# Patient Record
Sex: Female | Born: 1995
Health system: Southern US, Community
[De-identification: ages and names within clinical notes are randomized; demographics above are authoritative.]

## PROBLEM LIST (undated history)

## (undated) ENCOUNTER — Inpatient Hospital Stay (HOSPITAL_COMMUNITY): Payer: Self-pay

## (undated) DIAGNOSIS — R490 Dysphonia: Secondary | ICD-10-CM

## (undated) DIAGNOSIS — IMO0002 Reserved for concepts with insufficient information to code with codable children: Secondary | ICD-10-CM

## (undated) DIAGNOSIS — Z973 Presence of spectacles and contact lenses: Secondary | ICD-10-CM

## (undated) DIAGNOSIS — R197 Diarrhea, unspecified: Secondary | ICD-10-CM

## (undated) DIAGNOSIS — R61 Generalized hyperhidrosis: Secondary | ICD-10-CM

## (undated) DIAGNOSIS — R51 Headache: Secondary | ICD-10-CM

## (undated) DIAGNOSIS — R112 Nausea with vomiting, unspecified: Secondary | ICD-10-CM

## (undated) DIAGNOSIS — E669 Obesity, unspecified: Secondary | ICD-10-CM

## (undated) DIAGNOSIS — I10 Essential (primary) hypertension: Secondary | ICD-10-CM

## (undated) DIAGNOSIS — K625 Hemorrhage of anus and rectum: Secondary | ICD-10-CM

## (undated) DIAGNOSIS — A048 Other specified bacterial intestinal infections: Secondary | ICD-10-CM

## (undated) DIAGNOSIS — R5383 Other fatigue: Secondary | ICD-10-CM

## (undated) DIAGNOSIS — K3184 Gastroparesis: Secondary | ICD-10-CM

## (undated) DIAGNOSIS — K219 Gastro-esophageal reflux disease without esophagitis: Secondary | ICD-10-CM

## (undated) DIAGNOSIS — Z8711 Personal history of peptic ulcer disease: Secondary | ICD-10-CM

## (undated) HISTORY — DX: Headache: R51

## (undated) HISTORY — DX: Hemorrhage of anus and rectum: K62.5

## (undated) HISTORY — DX: Obesity, unspecified: E66.9

## (undated) HISTORY — DX: Nausea with vomiting, unspecified: R19.7

## (undated) HISTORY — DX: Presence of spectacles and contact lenses: Z97.3

## (undated) HISTORY — DX: Diarrhea, unspecified: R11.2

## (undated) HISTORY — DX: Essential (primary) hypertension: I10

## (undated) HISTORY — DX: Dysphonia: R49.0

## (undated) HISTORY — PX: MOUTH SURGERY: SHX715

## (undated) HISTORY — DX: Gastroparesis: K31.84

## (undated) HISTORY — DX: Generalized hyperhidrosis: R61

## (undated) HISTORY — DX: Other specified bacterial intestinal infections: A04.8

## (undated) HISTORY — DX: Reserved for concepts with insufficient information to code with codable children: IMO0002

## (undated) HISTORY — DX: Gastro-esophageal reflux disease without esophagitis: K21.9

## (undated) HISTORY — DX: Other fatigue: R53.83

---
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
## 2013-12-25 ENCOUNTER — Ambulatory Visit (INDEPENDENT_AMBULATORY_CARE_PROVIDER_SITE_OTHER): Payer: 59 | Admitting: Family Medicine

## 2013-12-25 ENCOUNTER — Encounter: Payer: Self-pay | Admitting: Family Medicine

## 2013-12-25 VITALS — BP 120/82 | Temp 99.2°F | Ht 65.5 in | Wt 310.0 lb

## 2013-12-25 DIAGNOSIS — R110 Nausea: Secondary | ICD-10-CM | POA: Insufficient documentation

## 2013-12-25 DIAGNOSIS — Z7189 Other specified counseling: Secondary | ICD-10-CM

## 2013-12-25 DIAGNOSIS — Z7689 Persons encountering health services in other specified circumstances: Secondary | ICD-10-CM

## 2013-12-25 DIAGNOSIS — N946 Dysmenorrhea, unspecified: Secondary | ICD-10-CM | POA: Insufficient documentation

## 2013-12-25 NOTE — Progress Notes (Signed)
Chief Complaint  Patient presents with  . Establish Care    HPI:  Miranda Hawkins is here to establish care.  Used to see pediatrician.   Has the following chronic problems and concerns today:  Patient Active Problem List   Diagnosis Date Noted  . Chronic nausea - hx gastroparesis, followed by Dr. Jarold Motto in GI 12/25/2013  . Dysmenorrhea, irr vaginal bleeding - followed by Dr. Henderson Cloud in gyn 12/25/2013  . Obesity, morbid 12/25/2013   HTN: -went to ED in the beginning of Jan and had elevated BP and told to see PCP -had abd pain and vomiting at the time - this is chronic and she is seeing GI in a few days  Chronic Vag Bleeding/dysmenorrhea: -for 3 months, followed by gyn - Dr. Henderson Cloud  -saw gyn in [place] a month ago and restarted nuvaring -spotting daily -reports labs check about 1 week ago in ED and not anemic -denies: CP, racing heart, dizziness  Health Maintenance: -does not want flu vaccine  ROS: See pertinent positives and negatives per HPI.  Past Medical History  Diagnosis Date  . Night sweats   . Fatigue   . Nausea vomiting and diarrhea   . Abdominal pain   . Rectal bleeding   . Headache(784.0)   . Hoarseness   . Wears glasses   . Helicobacter pylori (H. pylori)   . Obesity   . Gastroparesis   . Ulcer   . Hypertension     high blood pressure readings  . GERD (gastroesophageal reflux disease)     Family History  Problem Relation Age of Onset  . Diabetes Maternal Grandmother   . Arthritis Maternal Grandmother   . Heart disease Maternal Grandmother   . Diabetes Maternal Aunt   . Hypertension Mother   . Hyperlipidemia Mother   . Colon cancer Neg Hx   . Alcoholism Father   . Arthritis Father     paternal grandparents  . Alcoholism      maternal grandparents  . Hyperlipidemia      all four grandparents  . Heart disease Paternal Grandfather   . Stroke    . Mental illness      father's side    History   Social History  . Marital Status:  Single    Spouse Name: N/A    Number of Children: 0  . Years of Education: N/A   Occupational History  . Student     Social History Main Topics  . Smoking status: Never Smoker   . Smokeless tobacco: Never Used  . Alcohol Use: No  . Drug Use: Yes    Special: Marijuana  . Sexual Activity: None   Other Topics Concern  . 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  , Disp: , Rfl: ;  TraMADol HCl (ULTRAM PO), Take by mouth as directed., Disp: , Rfl:   EXAM:  Filed Vitals:   12/25/13 1611  BP: 120/82  Temp: 99.2 F (37.3 C)    Body mass index is 50.78 kg/(m^2).  GENERAL: vitals reviewed and listed above, alert, oriented, appears well hydrated and in no acute distress  HEENT: atraumatic, conjunttiva clear, no obvious abnormalities on inspection of external nose and ears  NECK: no obvious masses on inspection  LUNGS: clear to auscultation bilaterally, no wheezes, rales or rhonchi, good air movement  CV: HRRR, no peripheral edema  MS: moves all extremities without noticeable abnormality  PSYCH: pleasant and cooperative, no obvious depression or anxiety  ASSESSMENT AND PLAN:  Discussed the following assessment and plan:  Chronic nausea - hx gastroparesis, followed by Dr. Jarold MottoPatterson in GI  Dysmenorrhea, irr vaginal bleeding - followed by Dr. Henderson CloudHorvath in gyn  Encounter to establish care - Plan: Basic metabolic panel, CBC with Differential  Obesity, morbid - Plan: Basic metabolic panel, CBC with  Differential, Lipid Panel, Hemoglobin A1c  -We reviewed the PMH, PSH, FH, SH, Meds and Allergies. -We provided refills for any medications we will prescribe as needed. -We addressed current concerns per orders and patient instructions. -We have asked for records for pertinent exams, studies, vaccines and notes from previous providers. -We have advised patient to follow up per instructions below. -discussed lifestyle recs at length and offered nutrition referral - will check fasting labs  -Patient advised to return or notify a doctor immediately if symptoms worsen or persist or new concerns arise.  Patient Instructions  Please schedule a lab appointment for fasting labs on your way out: -We have ordered labs or studies at this visit. It can take up to 1-2 weeks for results and processing. We will contact you with instructions IF your results are abnormal. Normal results will be released to your [facility]. If you have not heard from us or can not find your results in [facility] in 2 weeks please contact our office.  -PLEASE SIGN UP FOR MYCHART TODAY   We recommend the following healthy lifestyle measures: - eat a healthy diet consisting of lots of vegetables, fruits, beans, nuts, seeds, healthy meats such as white chicken and fish and whole grains.  - avoid fried foods, fast food, processed foods, sodas, red meet and other fattening foods.  - get a least 150 minutes of aerobic exercise per week.   Follow up with your gynecologist regarding your bleeding and your gastroenterologist regarding your chronic nausea and vomiting.       Kriste BasqueKIM, Catarina Huntley R.

## 2013-12-25 NOTE — Progress Notes (Signed)
Pre visit review using our clinic review tool, if applicable. No additional management support is needed unless otherwise documented below in the visit note. 

## 2013-12-25 NOTE — Patient Instructions (Signed)
Please schedule a lab appointment for fasting labs on your way out: -We have ordered labs or studies at this visit. It can take up to 1-2 weeks for results and processing. We will contact you with instructions IF your results are abnormal. Normal results will be released to your [facility]. If you have not heard from us or can not find your results in [facility] in 2 weeks please contact our office.  -PLEASE SIGN UP FOR MYCHART TODAY   We recommend the following healthy lifestyle measures: - eat a healthy diet consisting of lots of vegetables, fruits, beans, nuts, seeds, healthy meats such as white chicken and fish and whole grains.  - avoid fried foods, fast food, processed foods, sodas, red meet and other fattening foods.  - get a least 150 minutes of aerobic exercise per week.   Follow up with your gynecologist regarding your bleeding and your gastroenterologist regarding your chronic nausea and vomiting.

## 2013-12-27 ENCOUNTER — Other Ambulatory Visit (INDEPENDENT_AMBULATORY_CARE_PROVIDER_SITE_OTHER): Payer: 59

## 2013-12-27 DIAGNOSIS — Z7689 Persons encountering health services in other specified circumstances: Secondary | ICD-10-CM

## 2013-12-27 DIAGNOSIS — Z7189 Other specified counseling: Secondary | ICD-10-CM

## 2013-12-27 LAB — LIPID PANEL
CHOL/HDL RATIO: 4
CHOLESTEROL: 207 mg/dL — AB (ref 0–200)
HDL: 48.1 mg/dL (ref >39.00)
Triglycerides: 71 mg/dL (ref 0.0–149.0)
VLDL: 14.2 mg/dL (ref 0.0–40.0)

## 2013-12-27 LAB — CBC WITH DIFFERENTIAL/PLATELET
Basophils Absolute: 0.1 10*3/uL (ref 0.0–0.1)
Basophils Relative: 0.5 % (ref 0.0–3.0)
EOS PCT: 1 % (ref 0.0–5.0)
Eosinophils Absolute: 0.1 10*3/uL (ref 0.0–0.7)
HEMATOCRIT: 38.4 % (ref 36.0–46.0)
HEMOGLOBIN: 12.3 g/dL (ref 12.0–15.0)
LYMPHS ABS: 2.3 10*3/uL (ref 0.7–4.0)
Lymphocytes Relative: 20.6 % (ref 12.0–46.0)
MCHC: 32 g/dL (ref 30.0–36.0)
MCV: 69.3 fl — ABNORMAL LOW (ref 78.0–100.0)
MONOS PCT: 4.5 % (ref 3.0–12.0)
Monocytes Absolute: 0.5 10*3/uL (ref 0.1–1.0)
Neutro Abs: 8.3 10*3/uL — ABNORMAL HIGH (ref 1.4–7.7)
Neutrophils Relative %: 73.4 % (ref 43.0–77.0)
PLATELETS: 467 10*3/uL — AB (ref 150.0–400.0)
RBC: 5.55 Mil/uL — ABNORMAL HIGH (ref 3.87–5.11)
RDW: 17.7 % — ABNORMAL HIGH (ref 11.5–14.6)
WBC: 11.3 10*3/uL — AB (ref 4.5–10.5)

## 2013-12-27 LAB — BASIC METABOLIC PANEL
BUN: 7 mg/dL (ref 6–23)
CO2: 26 mEq/L (ref 19–32)
Calcium: 9.5 mg/dL (ref 8.4–10.5)
Chloride: 106 mEq/L (ref 96–112)
Creatinine, Ser: 0.8 mg/dL (ref 0.4–1.2)
GFR: 125.13 mL/min (ref >60.00)
Glucose, Bld: 93 mg/dL (ref 70–99)
POTASSIUM: 4 meq/L (ref 3.5–5.1)
SODIUM: 138 meq/L (ref 135–145)

## 2013-12-27 LAB — HEMOGLOBIN A1C: Hgb A1c MFr Bld: 6.2 % (ref 4.6–6.5)

## 2013-12-27 LAB — LDL CHOLESTEROL, DIRECT: Direct LDL: 152.8 mg/dL

## 2013-12-28 ENCOUNTER — Encounter: Payer: Self-pay | Admitting: Gastroenterology

## 2013-12-28 ENCOUNTER — Ambulatory Visit (INDEPENDENT_AMBULATORY_CARE_PROVIDER_SITE_OTHER): Payer: 59 | Admitting: Gastroenterology

## 2013-12-28 VITALS — BP 100/76 | HR 86 | Ht 65.5 in | Wt 306.4 lb

## 2013-12-28 DIAGNOSIS — K219 Gastro-esophageal reflux disease without esophagitis: Secondary | ICD-10-CM

## 2013-12-28 DIAGNOSIS — K3184 Gastroparesis: Secondary | ICD-10-CM

## 2013-12-28 MED ORDER — OMEPRAZOLE 20 MG PO CPDR: 20.0000 mg | DELAYED_RELEASE_CAPSULE | Freq: Every day | ORAL | Status: DC

## 2013-12-28 MED ORDER — METOCLOPRAMIDE HCL 5 MG PO TABS: 5.0000 mg | ORAL_TABLET | Freq: Three times a day (TID) | ORAL | Status: DC

## 2013-12-28 NOTE — Patient Instructions (Signed)
Please make a follow up appointment in two weeks with Dr. Jarold MottoPatterson  New prescription for Prilosec was sent to your pharmacy   We have sent the following medications to your pharmacy for you to pick up at your convenience: Reglan 5 mg, please take one tablet by mouth three times daily thirty minutes before meals  Information on a Gastroparesis Diet is below for your review _______________________________________________________________________ Gastroparesis  Gastroparesis is also called slowed stomach emptying (delayed gastric emptying). It is a condition in which the stomach takes too long to empty its contents. It often happens in people with diabetes.  CAUSES  Gastroparesis happens when nerves to the stomach are damaged or stop working. When the nerves are damaged, the muscles of the stomach and intestines do not work normally. The movement of food is slowed or stopped. High blood glucose (sugar) causes changes in nerves and can damage the blood vessels that carry oxygen and nutrients to the nerves. RISK FACTORS  Diabetes.  Post-viral syndromes.  Eating disorders (anorexia, bulimia).  Surgery on the stomach or vagus nerve.  Gastroesophageal reflux disease (rarely).  Smooth muscle disorders (amyloidosis, scleroderma).  Metabolic disorders, including hypothyroidism.  Parkinson disease. SYMPTOMS   Heartburn.  Feeling sick to your stomach (nausea).  Vomiting of undigested food.  An early feeling of fullness when eating.  Weight loss.  Abdominal bloating.  Erratic blood glucose levels.  Lack of appetite.  Gastroesophageal reflux.  Spasms of the stomach wall. Complications can include:  Bacterial overgrowth in stomach. Food stays in the stomach and can ferment and cause bacteria to grow.  Weight loss due to difficulty digesting and absorbing nutrients.  Vomiting.  Obstruction in the stomach. Undigested food can harden and cause nausea and vomiting.  Blood  glucose fluctuations caused by inconsistent food absorption. DIAGNOSIS  The diagnosis of gastroparesis is confirmed through one or more of the following tests:  Barium X-rays and scans. These tests look at how long it takes for food to move through the stomach.  Gastric manometry. This test measures electrical and muscular activity in the stomach. A thin tube is passed down the throat into the stomach. The tube contains a wire that takes measurements of the stomach's electrical and muscular activity as it digests liquids and solid food.  Endoscopy. This procedure is done with a long, thin tube called an endoscope. It is passed through the mouth and gently down the esophagus into the stomach. This tube helps the caregiver look at the lining of the stomach to check for any abnormalities.  Ultrasonography. This can rule out gallbladder disease or pancreatitis. This test will outline and define the shape of the gallbladder and pancreas. TREATMENT   Treatments may include:  Exercise.  Medicines to control nausea and vomiting.  Medicines to stimulate stomach muscles.  Changes in what and when you eat.  Having smaller meals more often.  Eating low-fiber forms of high-fiber foods, such as eating cooked vegetables instead of raw vegetables.  Eating low-fat foods.  Consuming liquids, which are easier to digest.  In severe cases, feeding tubes and intravenous (IV) feeding may be needed. It is important to note that in most cases, treatment does not cure gastroparesis. It is usually a lasting (chronic) condition. Treatment helps you manage the underlying condition so that you can be as healthy and comfortable as possible. Other treatments  A gastric neurostimulator has been developed to assist people with gastroparesis. The battery-operated device is surgically implanted. It emits mild electrical pulses to help  improve stomach emptying and to control nausea and vomiting.  The use of  botulinum toxin has been shown to improve stomach emptying by decreasing the prolonged contractions of the muscle between the stomach and the small intestine (pyloric sphincter). The benefits are temporary. SEEK MEDICAL CARE IF:   You have diabetes and you are having problems keeping your blood glucose in goal range.  You are having nausea, vomiting, bloating, or early feelings of fullness with eating.  Your symptoms do not change with a change in diet. Document Released: 11/23/2005 Document Revised: 03/20/2013 Document Reviewed: 05/02/2009 [facility] Patient Information 2014 [place], [place].

## 2013-12-28 NOTE — Progress Notes (Signed)
This is a 18 year old African American female who has had gastroparesis for several years with previous emptying scan showing 100% retention at 2 hours.  She had previous endoscopy about pediatric gastroenterology which was normal, and negative H. pylori status.  She was placed on domperidone 10 mg 3 times a day with resolution of her symptoms, but discontinued this medication because of cost expense.  She's had intermittent repeated episodes of nausea and vomiting for the last 3 years with some associated pre-emesis discomfort over her gastric area in the left upper quadrant.  She is on Prilosec for acid reflux.  She denies any change in bowel habits, melena, hematochezia, history of hepatitis or pancreatitis.  She does not follow a gastroparesis diet there will.  Despite all these factors she has not lost weight and is morbidly obese.  She denies abuse of alcohol, cigarettes, or NSAIDs.  She is status post cholecystectomy and is also had a negative followup MRCP.  She denies any hepatobiliary symptoms such as clinical her stools, dark urine, icterus, fever chills.  She is followed by Dr. Henderson CloudHorvath in GYN and denies any chance of pregnancy.  Current Medications, Allergies, Past Medical History, Past Surgical History, Family History and Social History were [facility].  ROS: All systems were reviewed and are negative unless otherwise stated in the HPI.          Physical Exam: Morbidly obese patient in no distress.  Blood pressure 100/70, pulse 86 and regular and weight 306 with a BMI of 50.19.  Chest is clear and she is in a regular rhythm without murmurs gallops or rubs.  Abdomen shows no organomegaly, masses or tenderness.  She has a large fat apron in her lower abdomen.  Peripheral extremities are unremarkable.  Mental status is normal    Assessment and Plan: Confirmed idiopathic gastroparesis of unexplained etiology.  I have replaced her on a gastroparesis  step 3 diet and will try Reglan at low dose 5 mg 30 minutes before meals while continuing daily Prilosec.  General clinical course she may need repeat gastric emptying scan.  I've explained in detail the neurologic side effects of Reglan that she could experience any she does she should stop the medication and call us immediately.  She says to domperidone is too expensive and that she will not use this medication.  Therapeutic options in this case are therefore limited.  [facility] more sophisticated gastric motility studies and possible gastric pacemaker insertion.  Her morbid obesity is a problem that needs to be addressed by primary care physicians.  She is status post cholecystectomy for cholelithiasis in June 2012 and has had normal postop followup MRCP.  She's returning 2 weeks' time and we will recheck her liver function test which were not done with recent blood work that did show a normal CBC and metabolic profile.  Cc Dr, Gaylyn RongHa in 1 care

## 2014-01-12 ENCOUNTER — Encounter: Payer: Self-pay | Admitting: Family Medicine

## 2014-01-16 ENCOUNTER — Ambulatory Visit: Payer: 59 | Admitting: Gastroenterology

## 2014-01-26 ENCOUNTER — Other Ambulatory Visit: Payer: Self-pay | Admitting: Obstetrics and Gynecology

## 2014-05-29 ENCOUNTER — Emergency Department (INDEPENDENT_AMBULATORY_CARE_PROVIDER_SITE_OTHER)
Admission: EM | Admit: 2014-05-29 | Discharge: 2014-05-29 | Disposition: A | Discharge status: Home / Self Care | Payer: 59 | Source: Home / Self Care | Attending: Family Medicine | Admitting: Family Medicine

## 2014-05-29 ENCOUNTER — Encounter (HOSPITAL_COMMUNITY): Payer: Self-pay | Admitting: Emergency Medicine

## 2014-05-29 DIAGNOSIS — J0100 Acute maxillary sinusitis, unspecified: Secondary | ICD-10-CM

## 2014-05-29 LAB — POCT RAPID STREP A: STREPTOCOCCUS, GROUP A SCREEN (DIRECT): NEGATIVE

## 2014-05-29 MED ORDER — PREDNISONE 10 MG PO TABS: ORAL_TABLET | ORAL | Status: DC

## 2014-05-29 MED ORDER — FLUTICASONE PROPIONATE 50 MCG/ACT NA SUSP: 2.0000 | Freq: Two times a day (BID) | NASAL | Status: DC

## 2014-05-29 MED ORDER — AMOXICILLIN-POT CLAVULANATE 875-125 MG PO TABS: 1.0000 | ORAL_TABLET | Freq: Two times a day (BID) | ORAL | Status: DC

## 2014-05-29 MED ORDER — PSEUDOEPHEDRINE-IBUPROFEN 30-200 MG PO TABS
ORAL_TABLET | ORAL | Status: DC
Start: 2014-05-29 — End: 2015-02-27

## 2014-05-29 NOTE — Discharge Instructions (Signed)
Sinusitis Sinusitis is redness, soreness, and swelling (inflammation) of the paranasal sinuses. Paranasal sinuses are air pockets within the bones of your face (beneath the eyes, the middle of the forehead, or above the eyes). In healthy paranasal sinuses, mucus is able to drain out, and air is able to circulate through them by way of your nose. However, when your paranasal sinuses are inflamed, mucus and air can become trapped. This can allow bacteria and other germs to grow and cause infection. Sinusitis can develop quickly and last only a short time (acute) or continue over a long period (chronic). Sinusitis that lasts for more than 12 weeks is considered chronic.  CAUSES  Causes of sinusitis include:  Allergies.  Structural abnormalities, such as displacement of the cartilage that separates your nostrils (deviated septum), which can decrease the air flow through your nose and sinuses and affect sinus drainage.  Functional abnormalities, such as when the small hairs (cilia) that line your sinuses and help remove mucus do not work properly or are not present. SYMPTOMS  Symptoms of acute and chronic sinusitis are the same. The primary symptoms are pain and pressure around the affected sinuses. Other symptoms include:  Upper toothache.  Earache.  Headache.  Bad breath.  Decreased sense of smell and taste.  A cough, which worsens when you are lying flat.  Fatigue.  Fever.  Thick drainage from your nose, which often is green and may contain pus (purulent).  Swelling and warmth over the affected sinuses. DIAGNOSIS  Your caregiver will perform a physical exam. During the exam, your caregiver may:  Look in your nose for signs of abnormal growths in your nostrils (nasal polyps).  Tap over the affected sinus to check for signs of infection.  View the inside of your sinuses (endoscopy) with a special imaging device with a light attached (endoscope), which is inserted into your  sinuses. If your caregiver suspects that you have chronic sinusitis, one or more of the following tests may be recommended:  Allergy tests.  Nasal culture--A sample of mucus is taken from your nose and sent to a lab and screened for bacteria.  Nasal cytology--A sample of mucus is taken from your nose and examined by your caregiver to determine if your sinusitis is related to an allergy. TREATMENT  Most cases of acute sinusitis are related to a viral infection and will resolve on their own within 10 days. Sometimes medicines are prescribed to help relieve symptoms (pain medicine, decongestants, nasal steroid sprays, or saline sprays).  However, for sinusitis related to a bacterial infection, your caregiver will prescribe antibiotic medicines. These are medicines that will help kill the bacteria causing the infection.  Rarely, sinusitis is caused by a fungal infection. In theses cases, your caregiver will prescribe antifungal medicine. For some cases of chronic sinusitis, surgery is needed. Generally, these are cases in which sinusitis recurs more than 3 times per year, despite other treatments. HOME CARE INSTRUCTIONS   Drink plenty of water. Water helps thin the mucus so your sinuses can drain more easily.  Use a humidifier.  Inhale steam 3 to 4 times a day (for example, sit in the bathroom with the shower running).  Apply a warm, moist washcloth to your face 3 to 4 times a day, or as directed by your caregiver.  Use saline nasal sprays to help moisten and clean your sinuses.  Take over-the-counter or prescription medicines for pain, discomfort, or fever only as directed by your caregiver. SEEK IMMEDIATE MEDICAL CARE IF:    You have increasing pain or severe headaches.  You have nausea, vomiting, or drowsiness.  You have swelling around your face.  You have vision problems.  You have a stiff neck.  You have difficulty breathing. MAKE SURE YOU:   Understand these  instructions.  Will watch your condition.  Will get help right away if you are not doing well or get worse. Document Released: 11/23/2005 Document Revised: 02/15/2012 Document Reviewed: 12/08/2011 [employer] Patient Information 2015 [employer]. This information is not intended to replace advice given to you by your health care provider. Make sure you discuss any questions you have with your health care provider.  

## 2014-05-29 NOTE — ED Provider Notes (Signed)
Medical screening examination/treatment/procedure(s) were performed by resident physician or non-physician practitioner and as supervising physician I was immediately available for consultation/collaboration.   Johany Hansman DOUGLAS MD.   Andersyn Fragoso D Angelisa Winthrop, MD 05/29/14 1704 

## 2014-05-29 NOTE — ED Notes (Signed)
Sore throat and no ear pain, does have stuffy nose, headaches, throat pain with swallowing

## 2014-05-29 NOTE — ED Provider Notes (Signed)
CSN: [id number]     Arrival date & time 05/29/14  1548 History   First MD Initiated Contact with Patient 05/29/14 1620     Chief Complaint  Patient presents with  . Sore Throat   (Consider location/radiation/quality/duration/timing/severity/associated sxs/prior Treatment) HPI Comments: 18 year old female presents complaining of facial pain and sore throat. This started last night. She has constant, worsening pain in her face. She also admits to some nasal congestion. She has not taken any medication for treatment. She has no history of sinus infections. No fever, cough, NVD   Past Medical History  Diagnosis Date  . Night sweats   . Fatigue   . Nausea vomiting and diarrhea   . Abdominal pain   . Rectal bleeding   . Headache(784.0)   . Hoarseness   . Wears glasses   . Helicobacter pylori (H. pylori)   . Obesity   . Gastroparesis   . Ulcer   . Hypertension     high blood pressure readings  . GERD (gastroesophageal reflux disease)    Past Surgical History  Procedure Laterality Date  . Tonsillectomy and adenoidectomy  2002  . Mouth surgery  5 years ago  . Laparoscopic cholecystectomy  05/2011  . Esophagogastroduodenoscopy  02/2011   Family History  Problem Relation Age of Onset  . Diabetes Maternal Grandmother   . Arthritis Maternal Grandmother   . Heart disease Maternal Grandmother   . Diabetes Maternal Aunt   . Hypertension Mother   . Hyperlipidemia Mother   . Colon cancer Neg Hx   . Alcoholism Father   . Arthritis Father     paternal grandparents  . Alcoholism      maternal grandparents  . Hyperlipidemia      all four grandparents  . Heart disease Paternal Grandfather   . Stroke    . Mental illness      father's side   History  Substance Use Topics  . Smoking status: Never Smoker   . Smokeless tobacco: Never Used  . Alcohol Use: No   OB History   Grav Para Term Preterm Abortions TAB SAB Ect Mult Living                 Review of Systems  Constitutional:  Negative for fever and chills.  HENT: Positive for congestion, sinus pressure and sore throat. Negative for ear pain.   Respiratory: Negative for cough.   All other systems reviewed and are negative.   Allergies  Review of patient's allergies indicates no known allergies.  Home Medications   Prior to Admission medications   Medication Sig Start Date End Date Taking? Authorizing Provider  amoxicillin-clavulanate (AUGMENTIN) 875-125 MG per tablet Take 1 tablet by mouth every 12 (twelve) hours. 05/29/14   Adrian BlackwaterZachary H Baker, PA-C  fluticasone (FLONASE) 50 MCG/ACT nasal spray Place 2 sprays into both nostrils 2 (two) times daily. Decrease to 2 sprays/nostril daily after 5 days 05/29/14   Graylon GoodZachary H Baker, PA-C  metoCLOPramide (REGLAN) 5 MG tablet Take 1 tablet (5 mg total) by mouth 3 (three) times daily before meals. 12/28/13   Mardella Laymanavid R Patterson, MD  omeprazole (PRILOSEC) 20 MG capsule Take 1 capsule (20 mg total) by mouth daily. 12/28/13   Mardella Laymanavid R Patterson, MD  ondansetron (ZOFRAN-ODT) 4 MG disintegrating tablet Take by mouth every 8 (eight) hours as needed.      Historical Provider, MD  predniSONE (DELTASONE) 10 MG tablet 4 tabs PO QD for 4 days; 3 tabs PO QD for 3  days; 2 tabs PO QD for 2 days; 1 tab PO QD for 1 day 05/29/14   Graylon GoodZachary H Baker, PA-C  Pseudoephedrine-Ibuprofen 30-200 MG TABS 1-2 tabs PO Q6 hrs PRN 05/29/14   Graylon GoodZachary H Baker, PA-C  TraMADol HCl (ULTRAM PO) Take by mouth as directed.    Historical Provider, MD   BP 145/97  Pulse 92  Temp(Src) 98.6 F (37 C) (Oral)  Resp 16  SpO2 97%  LMP 05/08/2014 Physical Exam  Nursing note and vitals reviewed. Constitutional: She is oriented to person, place, and time. Vital signs are normal. She appears well-developed and well-nourished. No distress.  HENT:  Head: Normocephalic and atraumatic.  Nose: Mucosal edema and rhinorrhea present. Right sinus exhibits maxillary sinus tenderness. Right sinus exhibits no frontal sinus tenderness. Left  sinus exhibits maxillary sinus tenderness. Left sinus exhibits no frontal sinus tenderness.  Mouth/Throat: Uvula is midline, oropharynx is clear and moist and mucous membranes are normal.  Pulmonary/Chest: Effort normal. No respiratory distress.  Lymphadenopathy:       Head (right side): No submandibular, no tonsillar, no preauricular and no posterior auricular adenopathy present.       Head (left side): No submandibular, no tonsillar, no preauricular and no posterior auricular adenopathy present.    She has no cervical adenopathy.  Neurological: She is alert and oriented to person, place, and time. She has normal strength. Coordination normal.  Skin: Skin is warm and dry. No rash noted. She is not diaphoretic.  Psychiatric: She has a normal mood and affect. Judgment normal.    ED Course  Procedures (including critical care time) Labs Review Labs Reviewed  POCT RAPID STREP A ([facility] CARE ONLY)    Imaging Review No results found.   MDM   1. Acute maxillary sinusitis, recurrence not specified    Sinusitis, viral versus bacterial. Treat symptomatically for a few days, start antibiotic if not improving.   Meds ordered this encounter  Medications  . fluticasone (FLONASE) 50 MCG/ACT nasal spray    Sig: Place 2 sprays into both nostrils 2 (two) times daily. Decrease to 2 sprays/nostril daily after 5 days    Dispense:  16 g    Refill:  2    Order Specific Question:  Supervising Provider    Answer:  Linna HoffKINDL, JAMES D [phone][[id number]]  . predniSONE (DELTASONE) 10 MG tablet    Sig: 4 tabs PO QD for 4 days; 3 tabs PO QD for 3 days; 2 tabs PO QD for 2 days; 1 tab PO QD for 1 day    Dispense:  30 tablet    Refill:  0    Order Specific Question:  Supervising Provider    Answer:  Linna HoffKINDL, JAMES D [phone][[id number]]  . Pseudoephedrine-Ibuprofen 30-200 MG TABS    Sig: 1-2 tabs PO Q6 hrs PRN    Dispense:  30 each    Refill:  1    Order Specific Question:  Supervising Provider    Answer:  Linna HoffKINDL, JAMES D [phone][[id number]]  .  amoxicillin-clavulanate (AUGMENTIN) 875-125 MG per tablet    Sig: Take 1 tablet by mouth every 12 (twelve) hours.    Dispense:  14 tablet    Refill:  0    Order Specific Question:  Supervising Provider    Answer:  Bradd CanaryKINDL, JAMES D [[id number]]       Graylon GoodZachary H Baker, PA-C 05/29/14 1700

## 2014-05-31 LAB — CULTURE, GROUP A STREP

## 2015-02-05 ENCOUNTER — Other Ambulatory Visit: Payer: Self-pay | Admitting: Obstetrics and Gynecology

## 2015-02-27 ENCOUNTER — Encounter: Payer: Self-pay | Admitting: Internal Medicine

## 2015-02-27 ENCOUNTER — Ambulatory Visit (INDEPENDENT_AMBULATORY_CARE_PROVIDER_SITE_OTHER): Payer: 59 | Admitting: Internal Medicine

## 2015-02-27 VITALS — BP 110/70 | HR 102 | Temp 99.8°F | Resp 14 | Ht 66.0 in | Wt 302.8 lb

## 2015-02-27 DIAGNOSIS — R05 Cough: Secondary | ICD-10-CM | POA: Diagnosis not present

## 2015-02-27 DIAGNOSIS — J301 Allergic rhinitis due to pollen: Secondary | ICD-10-CM | POA: Diagnosis not present

## 2015-02-27 DIAGNOSIS — R059 Cough, unspecified: Secondary | ICD-10-CM

## 2015-02-27 MED ORDER — FLUTICASONE PROPIONATE 50 MCG/ACT NA SUSP: 2.0000 | Freq: Every day | NASAL | Status: DC

## 2015-02-27 MED ORDER — AMOXICILLIN 500 MG PO CAPS: 500.0000 mg | ORAL_CAPSULE | Freq: Three times a day (TID) | ORAL | Status: DC

## 2015-02-27 NOTE — Patient Instructions (Addendum)
Plain Mucinex (NOT D) for thick secretions ;force NON dairy fluids .   Nasal cleansing in the shower as discussed with lather of mild shampoo.After 10 seconds wash off lather while  exhaling through nostrils. Make sure that all residual soap is removed to prevent irritation.  Flonase 2  sprays in each nostril as needed. Use the "crossover" technique into opposite nostril spraying toward opposite ear @ 45 degree angle, not straight up into nostril.  Plain Allegra (NOT D )  160 daily , Loratidine 10 mg , OR Zyrtec 10 mg @ bedtime  as needed for itchy eyes & sneezing.Zicam Melts or Zinc lozenges as per package label for sore throat . Complementary options include  vitamin C 2000 mg daily; & Echinacea for 4-7 days. Report persistent or progressive fever; discolored nasal or chest secretions; or frontal headache or facial  pain.     Fill the  prescription for antibiotic if having fever, discolored nasal or discolored chest secretions in the next 48-72 hours.

## 2015-02-27 NOTE — Progress Notes (Signed)
Pre visit review using our clinic review tool, if applicable. No additional management support is needed unless otherwise documented below in the visit note. 

## 2015-02-27 NOTE — Progress Notes (Signed)
   Subjective:    Patient ID: Miranda Hawkins, female    DOB: 11/04/1996, 19 y.o.   MRN: [id number]  HPI  Her symptoms began 3 days ago as nasal congestion which has progressed. She now has sore throat, postnasal drainage, cough with clear sputum, subjective fever, chills, and sweats. She describes pressure discomfort in the maxillary sinus area and pain in her ears.  She has been using Mucinex without benefit   Review of Systems  She denies otic discharge. She has no frontal sinus pain. There've been no purulent secretions from her head or chest.  The cough is not associated with wheezing or shortness of breath. She denies significant itchy, watery eyes, or sneezing.    Objective:   Physical Exam General appearance:Adequately nourished; no acute distress or increased work of breathing is present.   BMI:48.89  Lymphatic: No  lymphadenopathy about the head, neck, or axilla .  Eyes: No conjunctival inflammation or lid edema is present. There is no scleral icterus.  Ears:  External ear exam shows no significant lesions or deformities.  Otoscopic examination reveals clear canals, tympanic membranes are intact bilaterally without bulging, retraction, inflammation or discharge.TMs dull  Nose:  External nasal examination shows no deformity or inflammation. Small post in the right external nare. Nasal mucosa are severely erythematous without lesions or exudates No septal dislocation or deviation.No obstruction to airflow.   Oral exam: Dental hygiene is good; lips and gums are healthy appearing.There is no oropharyngeal erythema or exudate .  Neck:  No deformities, thyromegaly, masses, or tenderness noted.   Supple with full range of motion without pain.   Heart:  Normal rate and regular rhythm. S1 and S2 normal without gallop, murmur, click,or rub .S4   Lungs:Chest clear to auscultation; no wheezes, rhonchi,rales ,or rubs present.  Extremities:  No cyanosis, edema, or clubbing  noted     Skin: Warm & dry w/o tenting . 2 small jewelry posts in the lumbosacral area No significant lesions or rash.        Assessment & Plan:  #1 severe rhinitis with septal inflammation   #2 cough secondary to post nasal drainage from #1  Criteria for rhinosinusitis are not present.  Plan: See orders and after visit summary

## 2016-07-06 ENCOUNTER — Encounter (HOSPITAL_COMMUNITY): Payer: Self-pay | Admitting: Emergency Medicine

## 2016-07-06 ENCOUNTER — Emergency Department (HOSPITAL_COMMUNITY)
Admission: EM | Admit: 2016-07-06 | Discharge: 2016-07-06 | Disposition: A | Discharge status: Home / Self Care | Payer: 59 | Attending: Emergency Medicine | Admitting: Emergency Medicine

## 2016-07-06 DIAGNOSIS — K3184 Gastroparesis: Secondary | ICD-10-CM | POA: Diagnosis not present

## 2016-07-06 DIAGNOSIS — N938 Other specified abnormal uterine and vaginal bleeding: Secondary | ICD-10-CM | POA: Insufficient documentation

## 2016-07-06 DIAGNOSIS — I10 Essential (primary) hypertension: Secondary | ICD-10-CM | POA: Diagnosis not present

## 2016-07-06 DIAGNOSIS — R112 Nausea with vomiting, unspecified: Secondary | ICD-10-CM | POA: Diagnosis present

## 2016-07-06 LAB — CBC WITH DIFFERENTIAL/PLATELET
Basophils Absolute: 0 10*3/uL (ref 0.0–0.1)
Basophils Relative: 0 %
EOS ABS: 0.1 10*3/uL (ref 0.0–0.7)
EOS PCT: 0 %
HCT: 35.9 % — ABNORMAL LOW (ref 36.0–46.0)
Hemoglobin: 11.2 g/dL — ABNORMAL LOW (ref 12.0–15.0)
Lymphocytes Relative: 15 %
Lymphs Abs: 2.4 10*3/uL (ref 0.7–4.0)
MCH: 22.4 pg — ABNORMAL LOW (ref 26.0–34.0)
MCHC: 31.2 g/dL (ref 30.0–36.0)
MCV: 71.7 fL — ABNORMAL LOW (ref 78.0–100.0)
MONO ABS: 0.7 10*3/uL (ref 0.1–1.0)
Monocytes Relative: 4 %
Neutro Abs: 12.8 10*3/uL — ABNORMAL HIGH (ref 1.7–7.7)
Neutrophils Relative %: 81 %
Platelets: 532 10*3/uL — ABNORMAL HIGH (ref 150–400)
RBC: 5.01 MIL/uL (ref 3.87–5.11)
RDW: 18.1 % — AB (ref 11.5–15.5)
WBC: 16 10*3/uL — ABNORMAL HIGH (ref 4.0–10.5)

## 2016-07-06 LAB — COMPREHENSIVE METABOLIC PANEL
ALBUMIN: 4.1 g/dL (ref 3.5–5.0)
ALK PHOS: 100 U/L (ref 38–126)
ALT: 28 U/L (ref 14–54)
ANION GAP: 9 (ref 5–15)
AST: 35 U/L (ref 15–41)
CHLORIDE: 107 mmol/L (ref 101–111)
CO2: 22 mmol/L (ref 22–32)
CREATININE: 0.72 mg/dL (ref 0.44–1.00)
Calcium: 9.7 mg/dL (ref 8.9–10.3)
GFR calc Af Amer: >60 mL/min (ref >60)
GFR calc non Af Amer: >60 mL/min (ref >60)
GLUCOSE: 99 mg/dL (ref 65–99)
Potassium: 3.8 mmol/L (ref 3.5–5.1)
SODIUM: 138 mmol/L (ref 135–145)
TOTAL PROTEIN: 7.6 g/dL (ref 6.5–8.1)
Total Bilirubin: 0.5 mg/dL (ref 0.3–1.2)

## 2016-07-06 LAB — URINE MICROSCOPIC-ADD ON

## 2016-07-06 LAB — URINALYSIS, ROUTINE W REFLEX MICROSCOPIC
Glucose, UA: NEGATIVE mg/dL
Ketones, ur: 15 mg/dL — AB
Nitrite: NEGATIVE
PROTEIN: 30 mg/dL — AB
Specific Gravity, Urine: 1.026 (ref 1.005–1.030)
pH: 6 (ref 5.0–8.0)

## 2016-07-06 LAB — LIPASE, BLOOD: LIPASE: 18 U/L (ref 11–51)

## 2016-07-06 LAB — I-STAT BETA HCG BLOOD, ED (MC, WL, AP ONLY)

## 2016-07-06 MED ORDER — ONDANSETRON 4 MG PO TBDP
ORAL_TABLET | ORAL | Status: AC
  Filled 2016-07-06: qty 1

## 2016-07-06 MED ORDER — METOCLOPRAMIDE HCL 10 MG PO TABS: 5.0000 mg | ORAL_TABLET | Freq: Four times a day (QID) | ORAL | 0 refills | Status: DC

## 2016-07-06 MED ORDER — METOCLOPRAMIDE HCL 5 MG/ML IJ SOLN
10.0000 mg | Freq: Once | INTRAMUSCULAR | Status: AC
  Administered 2016-07-06: 10 mg via INTRAVENOUS
  Filled 2016-07-06: qty 2

## 2016-07-06 MED ORDER — SODIUM CHLORIDE 0.9 % IV BOLUS (SEPSIS)
1000.0000 mL | Freq: Once | INTRAVENOUS | Status: AC
  Administered 2016-07-06: 1000 mL via INTRAVENOUS

## 2016-07-06 MED ORDER — PANTOPRAZOLE SODIUM 40 MG PO TBEC
40.0000 mg | DELAYED_RELEASE_TABLET | Freq: Every day | ORAL | Status: DC
  Administered 2016-07-06: 40 mg via ORAL
  Filled 2016-07-06 (×2): qty 1

## 2016-07-06 MED ORDER — OMEPRAZOLE 20 MG PO CPDR: 20.0000 mg | DELAYED_RELEASE_CAPSULE | Freq: Every day | ORAL | 0 refills | Status: DC

## 2016-07-06 MED ORDER — ONDANSETRON 4 MG PO TBDP
4.0000 mg | ORAL_TABLET | Freq: Once | ORAL | Status: AC | PRN
  Administered 2016-07-06: 4 mg via ORAL

## 2016-07-06 NOTE — ED Provider Notes (Signed)
[facility] Provider Note   CSN: [id number] Arrival date & time: 07/06/16  1651  First Provider Contact:  None       History   Chief Complaint Chief Complaint  Patient presents with  . Emesis  . Abdominal Pain    HPI Miranda Hawkins is a 20 y.o. female.  HPI  This is a patient that has a history of idiopathic gastroparesis previously diagnosed by gastroenterology, presents today for emesis.  She had a gastric getting study in 2015 with significantly impaired motility. She was put on droperidol and Reglan at that point with resolution of her symptoms. She was eventually lost to follow-up and stopped taking her medicines.she now presents with recurrence of epigastric abdominal pain and emesis for a few months. It's worse with eating. She denies any fevers at home. She has worsening of emesis over the past 2 days, associated epigastric abdominal pain that is intermittent, aching,  No exacerbating or alleviating factors. She also mentions some streaks of blood in her vomit once today.  Past Medical History:  Diagnosis Date  . Abdominal pain   . Fatigue   . Gastroparesis   . GERD (gastroesophageal reflux disease)   . Headache(784.0)   . Helicobacter pylori (H. pylori)   . Hoarseness   . Hypertension    high blood pressure readings  . Nausea vomiting and diarrhea   . Night sweats   . Obesity   . Rectal bleeding   . Ulcer   . Wears glasses     Patient Active Problem List   Diagnosis Date Noted  . Chronic nausea - hx gastroparesis, followed by Dr. Jarold Motto in GI 12/25/2013  . Dysmenorrhea, irr vaginal bleeding - followed by Dr. Henderson Cloud in gyn 12/25/2013  . Obesity, morbid (HCC) 12/25/2013    Past Surgical History:  Procedure Laterality Date  . ESOPHAGOGASTRODUODENOSCOPY  02/2011  . LAPAROSCOPIC CHOLECYSTECTOMY  05/2011  . MOUTH SURGERY  5 years ago  . TONSILLECTOMY AND ADENOIDECTOMY  2002    OB History    No data available       Home Medications     Prior to Admission medications   Medication Sig Start Date End Date Taking? Authorizing Provider  amoxicillin (AMOXIL) 500 MG capsule Take 1 capsule (500 mg total) by mouth 3 (three) times daily. 02/27/15   Pecola Lawless, MD  fluticasone (FLONASE) 50 MCG/ACT nasal spray Place 2 sprays into both nostrils daily. 02/27/15   Pecola Lawless, MD  norgestimate-ethinyl estradiol (ORTHO-CYCLEN,SPRINTEC,PREVIFEM) 0.25-35 MG-MCG tablet Take 1 tablet by mouth daily.    Historical Provider, MD  ondansetron (ZOFRAN-ODT) 4 MG disintegrating tablet Take by mouth every 8 (eight) hours as needed.      Historical Provider, MD    Family History Family History  Problem Relation Age of Onset  . Hypertension Mother   . Hyperlipidemia Mother   . Alcoholism Father   . Arthritis Father     paternal grandparents  . Diabetes Maternal Grandmother   . Arthritis Maternal Grandmother   . Heart disease Maternal Grandmother   . Diabetes Maternal Aunt   . Alcoholism      maternal grandparents  . Hyperlipidemia      all four grandparents  . Stroke    . Mental illness      father's side  . Heart disease Paternal Grandfather   . Colon cancer Neg Hx     Social History Social History  Substance Use Topics  . Smoking status: Never Smoker  .  Smokeless tobacco: Never Used  . Alcohol use No     Allergies   Review of patient's allergies indicates no known allergies.   Review of Systems Review of Systems  Constitutional: Negative for chills and fever.  HENT: Negative for ear pain and sore throat.   Eyes: Negative for pain and visual disturbance.  Respiratory: Negative for cough and shortness of breath.   Cardiovascular: Negative for chest pain and palpitations.  Gastrointestinal: Positive for nausea and vomiting. Negative for abdominal pain.  Genitourinary: Negative for dysuria and hematuria.  Musculoskeletal: Negative for arthralgias and back pain.  Skin: Negative for color change and rash.   Neurological: Negative for seizures and syncope.  All other systems reviewed and are negative.    Physical Exam Updated Vital Signs BP 147/100 (BP Location: Right Arm)   Pulse 79   Temp 97.3 F (36.3 C) (Oral)   Resp 18   Ht 5\' 5"  (1.651 m)   Wt (!) 137 kg   LMP 06/29/2016   SpO2 100%   BMI 50.26 kg/m   Physical Exam  Constitutional: She appears well-developed and well-nourished. No distress.  HENT:  Head: Normocephalic and atraumatic.  Eyes: Conjunctivae are normal.  Neck: Neck supple.  Cardiovascular: Normal rate and regular rhythm.   No murmur heard. Pulmonary/Chest: Effort normal and breath sounds normal. No respiratory distress.  Abdominal: Soft. There is no tenderness.  Musculoskeletal: She exhibits no edema.  Neurological: She is alert.  Skin: Skin is warm and dry.  Psychiatric: She has a normal mood and affect.  Nursing note and vitals reviewed.    ED Treatments / Results  Labs (all labs ordered are listed, but only abnormal results are displayed) Labs Reviewed  COMPREHENSIVE METABOLIC PANEL - Abnormal; Notable for the following:       Result Value   BUN <5 (*)    All other components within normal limits  CBC WITH DIFFERENTIAL/PLATELET - Abnormal; Notable for the following:    WBC 16.0 (*)    Hemoglobin 11.2 (*)    HCT 35.9 (*)    MCV 71.7 (*)    MCH 22.4 (*)    RDW 18.1 (*)    Platelets 532 (*)    Neutro Abs 12.8 (*)    All other components within normal limits  LIPASE, BLOOD  URINALYSIS, ROUTINE W REFLEX MICROSCOPIC (NOT AT [facility])  I-STAT BETA HCG BLOOD, ED ([facility], [facility], [facility] ONLY)    EKG  EKG Interpretation None       Radiology No results found.  Procedures Procedures (including critical care time)  Medications Ordered in ED Medications  ondansetron (ZOFRAN-ODT) 4 MG disintegrating tablet (not administered)  ondansetron (ZOFRAN-ODT) disintegrating tablet 4 mg (4 mg Oral Given 07/06/16 1807)     Initial Impression / Assessment and  Plan / ED Course  I have reviewed the triage vital signs and the nursing notes.  Pertinent labs & imaging results that were available during my care of the patient were reviewed by me and considered in my medical decision making (see chart for details).  Clinical Course    On ssessment patient was stable. She denied any significant abdominal tenderness. Given patient's review chart and discussion her today this is likely recurrence of her gastroparesis. She improved here with some IV Reglan and IV fluids. She was tolerating by mouth without vomiting. Patient does have some leukocytosis, however have low concern for intra-abdominal infection given her abdominal exam. Leukocytosis likely from the consistent vomiting. Patient has follow-up appointment  with GI this month. Given her enough Reglan and antacids and to make an appointment.  We have discussed the discharge plan, including the plan for outpatient followup, and strict return precautions, including those that would require calling 911.    Final Clinical Impressions(s) / ED Diagnoses   Final diagnoses:  None    New Prescriptions New Prescriptions   No medications on file     Marcelina Morel, MD 07/06/16 6294    Gerhard Munch, MD 07/07/16 [phone]

## 2016-07-06 NOTE — ED Triage Notes (Signed)
Pt reports hx of GI issues, saw a GI MD in 2015 and was taking an experimental medication for chronic emesis. The medication cured the condition. Pt unsure of name of this. For several months pt emesis has come back, pt has been vomiting about 3 times a week. Pt called for an appointment with her GI doctor but they cannot see her until the end of August. 5 episodes of emesis today. Pt unable to keep food or drink down. LMP last week.

## 2016-07-07 MED FILL — OMEPRAZOLE DR 20 MG CAPSULE: 20 | 30 days supply | Qty: 30 | Fill #0

## 2016-07-07 MED FILL — METOCLOPRAMIDE 10 MG TABLET: 10 | 30 days supply | Qty: 60 | Fill #0

## 2016-07-10 ENCOUNTER — Ambulatory Visit (INDEPENDENT_AMBULATORY_CARE_PROVIDER_SITE_OTHER): Payer: 59 | Admitting: Family Medicine

## 2016-07-10 ENCOUNTER — Encounter: Payer: Self-pay | Admitting: Family Medicine

## 2016-07-10 ENCOUNTER — Encounter: Payer: Self-pay | Admitting: *Deleted

## 2016-07-10 ENCOUNTER — Other Ambulatory Visit (HOSPITAL_COMMUNITY)
Admission: RE | Admit: 2016-07-10 | Discharge: 2016-07-10 | Disposition: A | Discharge status: Home / Self Care | Payer: 59 | Source: Ambulatory Visit | Attending: Family Medicine | Admitting: Family Medicine

## 2016-07-10 VITALS — BP 124/82 | HR 83 | Temp 98.4°F | Ht 65.0 in | Wt 294.2 lb

## 2016-07-10 DIAGNOSIS — Z113 Encounter for screening for infections with a predominantly sexual mode of transmission: Secondary | ICD-10-CM | POA: Diagnosis not present

## 2016-07-10 DIAGNOSIS — N760 Acute vaginitis: Secondary | ICD-10-CM | POA: Insufficient documentation

## 2016-07-10 DIAGNOSIS — R110 Nausea: Secondary | ICD-10-CM

## 2016-07-10 DIAGNOSIS — N926 Irregular menstruation, unspecified: Secondary | ICD-10-CM

## 2016-07-10 DIAGNOSIS — R112 Nausea with vomiting, unspecified: Secondary | ICD-10-CM | POA: Diagnosis not present

## 2016-07-10 LAB — POCT URINALYSIS DIPSTICK
BILIRUBIN UA: NEGATIVE
GLUCOSE UA: NEGATIVE
Ketones, UA: 40
Leukocytes, UA: NEGATIVE
Nitrite, UA: NEGATIVE
SPEC GRAV UA: 1.02
UROBILINOGEN UA: 1
pH, UA: 7

## 2016-07-10 LAB — CBC
HCT: 34 % — ABNORMAL LOW (ref 36.0–46.0)
Hemoglobin: 11 g/dL — ABNORMAL LOW (ref 12.0–15.0)
MCHC: 32.5 g/dL (ref 30.0–36.0)
MCV: 69.2 fl — AB (ref 78.0–100.0)
Platelets: 475 10*3/uL — ABNORMAL HIGH (ref 150.0–400.0)
RBC: 4.91 Mil/uL (ref 3.87–5.11)
RDW: 18.8 % — AB (ref 11.5–15.5)
WBC: 10 10*3/uL (ref 4.0–10.5)

## 2016-07-10 NOTE — Progress Notes (Signed)
Pre visit review using our clinic review tool, if applicable. No additional management support is needed unless otherwise documented below in the visit note. 

## 2016-07-10 NOTE — Progress Notes (Addendum)
HPI: Acute visit for follow up gastroparesis. Hx of same with GI management in the past, then appears did not follow up and ran out of medications.  Not seen since established care in 12/2013. Reports: return of nausea and fomiting intermittently for many months; acute worsening for last 3-4 days with nausea, vomiting, diarrhea, GERD and intermittent abd cramping. No vomiting today - taking the reglan and thinks helping. Still with nausea, abd pain intermittently. Has not eaten anything. Reports is drinking fluids. FDLMP: irregular, spotting currently, recently restarted her OCPs after being off of them for many months, had period a few weeks ago as well. Sees gyn for this and reports sees them yearly and utd on pap. Sick contacts with stomach bug. Denies:fevers, dysuria, vaginal discharge, focal oer constant abd pain, hematochezia, blood in emesis She is sexually active and does want testing for GC/Chlam even though denies unprotected sex or vaginal symptoms. Has appointment with her gastroenterologist later this month. In ER had labs and urine - dip was actually abnormal and micro showed many bacteria, leuks, epis, rbcs. Culture not done. Mild white count elevation - presumed 2ndary to vomiting per ER provider notes.  ROS: See pertinent positives and negatives per HPI.  Past Medical History:  Diagnosis Date  . Abdominal pain   . Fatigue   . Gastroparesis   . GERD (gastroesophageal reflux disease)   . Headache(784.0)   . Helicobacter pylori (H. pylori)   . Hoarseness   . Hypertension    high blood pressure readings  . Nausea vomiting and diarrhea   . Night sweats   . Obesity   . Rectal bleeding   . Ulcer   . Wears glasses     Past Surgical History:  Procedure Laterality Date  . ESOPHAGOGASTRODUODENOSCOPY  02/2011  . LAPAROSCOPIC CHOLECYSTECTOMY  05/2011  . MOUTH SURGERY  5 years ago  . TONSILLECTOMY AND ADENOIDECTOMY  2002    Family History  Problem Relation Age of Onset  .  Hypertension Mother   . Hyperlipidemia Mother   . Alcoholism Father   . Arthritis Father     paternal grandparents  . Diabetes Maternal Grandmother   . Arthritis Maternal Grandmother   . Heart disease Maternal Grandmother   . Diabetes Maternal Aunt   . Alcoholism      maternal grandparents  . Hyperlipidemia      all four grandparents  . Stroke    . Mental illness      father's side  . Heart disease Paternal Grandfather   . Colon cancer Neg Hx     Social History   Social History  . Marital status: Single    Spouse name: N/A  . Number of children: 0  . Years of education: N/A   Occupational History  . Student     Social History Main Topics  . Smoking status: Never Smoker  . Smokeless tobacco: Never Used  . Alcohol use No  . Drug use:     Types: Marijuana  . Sexual activity: Not Asked   Other Topics Concern  . 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  metoCLOPramide (REGLAN) 10 MG tablet, Take 0.5 tablets (5 mg total) by mouth every 6 (six) hours., Disp: 62 tablet, Rfl: 0 .  norgestimate-ethinyl estradiol (ORTHO-CYCLEN,SPRINTEC,PREVIFEM) 0.25-35 MG-MCG tablet, Take 1 tablet by mouth daily., Disp: , Rfl:  .  omeprazole (PRILOSEC) 20 MG capsule, Take 1 capsule (20 mg total) by mouth daily., Disp: 31 capsule, Rfl: 0  EXAM:  Vitals:   07/10/16 1407  BP: 124/82  Pulse: 83  Temp: 98.4 F (36.9 C)    Body mass index is 48.96 kg/m.  GENERAL: obese, vitals reviewed and listed above, alert, oriented, appears well hydrated and in no acute distress  HEENT: atraumatic, conjunttiva clear, no obvious abnormalities on inspection of external nose and ears  NECK: no obvious masses on  inspection  LUNGS: clear to auscultation bilaterally, no wheezes, rales or rhonchi, good air movement  CV: HRRR, no peripheral edema  ABD: BS+, soft, NTTP  GU: normal ext genitalia, normal vaginal and cervical exam except for menstrual bleeding, no CMT  MS: moves all extremities without noticeable abnormality  PSYCH: pleasant and cooperative, no obvious depression or anxiety  ASSESSMENT AND PLAN:  Discussed the following assessment and plan: More than 50% of over 40 minutes spent in total in caring for this patient was spent face-to-face with the patient, counseling and/or coordinating care.    Chronic nausea - hx gastroparesis, followed by Dr. Jarold Motto in GI - Plan: CBC (no diff), Celiac Ab tTG DGP TIgA  Irregular menstrual cycle - Plan: Cervicovaginal ancillary only  Nausea and vomiting, vomiting of unspecified type - Plan: POC Urinalysis Dipstick, Culture, Urine  Obesity, Class III, BMI 40-49.9 (morbid obesity) (HCC)  -we discussed possible serious and likely etiologies, workup and treatment, treatment risks and return precautions -likely gastroparesis with possible worsening related to restarting OCPs, gastroenteritis, UTI vs other -repeat urine studies and coached on clean catch, will get culture as well, she wants to repeat cbc and check for celiac as mother thinks never done -pelvic exam without CMT, GC/Chlam/tich pending - no vaginal symptoms and always uses condoms per her report - but she did want this testing when we discuss potential causes abd pain -benign abd exam today, so feel risks > benefits for CT scan not, but return and emergency precuations discussed -offered repeat labs -plenty of fluids with electrolytes, PPI, reglan, bland diet -follow up with GI as scheduled -CPE in 3 months and needs lipids, hgba1c, TSH once over acute illness -Patient advised to return or notify a doctor immediately if symptoms worsen or persist or new concerns arise.  Patient  Instructions  BEFORE YOU LEAVE: -follow up: physical in 3 months -labs  Bland diet and avoid dairy for 1 week. Plenty of fluids with electrolytes.  Please seek care immediately if severe abd pain, fevers > 101, inability to tolerate oral intake of fluids or worsening instead of improving.  Take your reglan and prilosec as instructed.  Follow up with your gastroenterologist as planned.  Follow up with your gynecologist yearly and if irregular bleeding persist.    Kriste Basque R., DO

## 2016-07-10 NOTE — Patient Instructions (Addendum)
BEFORE YOU LEAVE: -follow up: physical in 3 months -labs  Bland diet and avoid dairy for 1 week. Plenty of fluids with electrolytes.  Please seek care immediately if severe abd pain, fevers > 101, inability to tolerate oral intake of fluids or worsening instead of improving.  Take your reglan and prilosec as instructed.  Follow up with your gastroenterologist as planned.  Follow up with your gynecologist yearly and if irregular bleeding persist.

## 2016-07-11 ENCOUNTER — Encounter (HOSPITAL_COMMUNITY): Payer: Self-pay | Admitting: Emergency Medicine

## 2016-07-11 ENCOUNTER — Emergency Department (HOSPITAL_COMMUNITY): Payer: 59

## 2016-07-11 ENCOUNTER — Emergency Department (HOSPITAL_COMMUNITY)
Admission: EM | Admit: 2016-07-11 | Discharge: 2016-07-11 | Disposition: A | Discharge status: Home / Self Care | Payer: 59 | Attending: Emergency Medicine | Admitting: Emergency Medicine

## 2016-07-11 DIAGNOSIS — R1013 Epigastric pain: Secondary | ICD-10-CM | POA: Diagnosis not present

## 2016-07-11 DIAGNOSIS — R109 Unspecified abdominal pain: Secondary | ICD-10-CM | POA: Diagnosis present

## 2016-07-11 DIAGNOSIS — I10 Essential (primary) hypertension: Secondary | ICD-10-CM | POA: Insufficient documentation

## 2016-07-11 DIAGNOSIS — R112 Nausea with vomiting, unspecified: Secondary | ICD-10-CM | POA: Diagnosis not present

## 2016-07-11 LAB — COMPREHENSIVE METABOLIC PANEL
ALT: 103 U/L — ABNORMAL HIGH (ref 14–54)
ANION GAP: 12 (ref 5–15)
AST: 77 U/L — ABNORMAL HIGH (ref 15–41)
Albumin: 4.8 g/dL (ref 3.5–5.0)
Alkaline Phosphatase: 117 U/L (ref 38–126)
BUN: <5 mg/dL — ABNORMAL LOW (ref 6–20)
CO2: 20 mmol/L — AB (ref 22–32)
Calcium: 9.7 mg/dL (ref 8.9–10.3)
Chloride: 106 mmol/L (ref 101–111)
Creatinine, Ser: 0.61 mg/dL (ref 0.44–1.00)
GFR calc Af Amer: >60 mL/min (ref >60)
GFR calc non Af Amer: >60 mL/min (ref >60)
GLUCOSE: 107 mg/dL — AB (ref 65–99)
POTASSIUM: 3.2 mmol/L — AB (ref 3.5–5.1)
SODIUM: 138 mmol/L (ref 135–145)
Total Bilirubin: 0.6 mg/dL (ref 0.3–1.2)
Total Protein: 8.8 g/dL — ABNORMAL HIGH (ref 6.5–8.1)

## 2016-07-11 LAB — URINALYSIS, ROUTINE W REFLEX MICROSCOPIC
GLUCOSE, UA: NEGATIVE mg/dL
Ketones, ur: 40 mg/dL — AB
LEUKOCYTES UA: NEGATIVE
Nitrite: NEGATIVE
PH: 7 (ref 5.0–8.0)
PROTEIN: NEGATIVE mg/dL
Specific Gravity, Urine: 1.025 (ref 1.005–1.030)

## 2016-07-11 LAB — CBC WITH DIFFERENTIAL/PLATELET
BASOS ABS: 0 10*3/uL (ref 0.0–0.1)
Basophils Relative: 0 %
EOS PCT: 0 %
Eosinophils Absolute: 0 10*3/uL (ref 0.0–0.7)
HCT: 37.2 % (ref 36.0–46.0)
Hemoglobin: 12 g/dL (ref 12.0–15.0)
Lymphocytes Relative: 13 %
Lymphs Abs: 2.3 10*3/uL (ref 0.7–4.0)
MCH: 22.5 pg — ABNORMAL LOW (ref 26.0–34.0)
MCHC: 32.3 g/dL (ref 30.0–36.0)
MCV: 69.8 fL — AB (ref 78.0–100.0)
MONO ABS: 0.9 10*3/uL (ref 0.1–1.0)
Monocytes Relative: 5 %
NEUTROS PCT: 82 %
Neutro Abs: 14.6 10*3/uL — ABNORMAL HIGH (ref 1.7–7.7)
PLATELETS: 554 10*3/uL — AB (ref 150–400)
RBC: 5.33 MIL/uL — AB (ref 3.87–5.11)
RDW: 17.8 % — ABNORMAL HIGH (ref 11.5–15.5)
WBC: 17.8 10*3/uL — AB (ref 4.0–10.5)

## 2016-07-11 LAB — URINE MICROSCOPIC-ADD ON
BACTERIA UA: NONE SEEN
RBC / HPF: NONE SEEN RBC/hpf (ref 0–5)
SQUAMOUS EPITHELIAL / LPF: NONE SEEN

## 2016-07-11 LAB — I-STAT BETA HCG BLOOD, ED (MC, WL, AP ONLY): I-stat hCG, quantitative: <5 m[IU]/mL (ref <5)

## 2016-07-11 LAB — LIPASE, BLOOD: LIPASE: 25 U/L (ref 11–51)

## 2016-07-11 MED ORDER — FENTANYL CITRATE (PF) 100 MCG/2ML IJ SOLN
50.0000 ug | Freq: Once | INTRAMUSCULAR | Status: AC
  Administered 2016-07-11: 50 ug via INTRAVENOUS
  Filled 2016-07-11: qty 2

## 2016-07-11 MED ORDER — SODIUM CHLORIDE 0.9 % IV BOLUS (SEPSIS)
1000.0000 mL | Freq: Once | INTRAVENOUS | Status: AC
  Administered 2016-07-11: 1000 mL via INTRAVENOUS

## 2016-07-11 MED ORDER — ONDANSETRON HCL 4 MG/2ML IJ SOLN
4.0000 mg | Freq: Once | INTRAMUSCULAR | Status: AC | PRN
  Administered 2016-07-11: 4 mg via INTRAVENOUS
  Filled 2016-07-11: qty 2

## 2016-07-11 MED ORDER — HALOPERIDOL LACTATE 5 MG/ML IJ SOLN
2.0000 mg | Freq: Once | INTRAMUSCULAR | Status: AC
  Administered 2016-07-11: 2 mg via INTRAVENOUS
  Filled 2016-07-11: qty 1

## 2016-07-11 MED ORDER — POTASSIUM CHLORIDE ER 10 MEQ PO TBCR
10.0000 meq | EXTENDED_RELEASE_TABLET | Freq: Two times a day (BID) | ORAL | 0 refills | Status: DC
Start: 2016-07-11 — End: 2016-08-03

## 2016-07-11 MED ORDER — ONDANSETRON 4 MG PO TBDP: 4.0000 mg | ORAL_TABLET | Freq: Three times a day (TID) | ORAL | 0 refills | Status: DC | PRN

## 2016-07-11 MED ORDER — KETOROLAC TROMETHAMINE 30 MG/ML IJ SOLN
30.0000 mg | Freq: Once | INTRAMUSCULAR | Status: AC
  Administered 2016-07-11: 30 mg via INTRAVENOUS
  Filled 2016-07-11: qty 1

## 2016-07-11 NOTE — ED Triage Notes (Signed)
Pt c/o LUQ pain that although she endorses is familiar, states it is severe and has "caused her to pass out twice today."

## 2016-07-11 NOTE — ED Notes (Signed)
Pt is requesting to urgently leave, stating she does not have anyone to be with her children. She has declined any other care including imaging that had been ordered.

## 2016-07-12 LAB — URINE CULTURE: Organism ID, Bacteria: 10000

## 2016-07-12 NOTE — ED Provider Notes (Signed)
[facility]-EMERGENCY DEPT Provider Note   CSN: [id number] Arrival date & time: 07/11/16  4098  First Provider Contact:  None       History   Chief Complaint Chief Complaint  Patient presents with  . Abdominal Pain    HPI Miranda Hawkins is a 20 y.o. female.  HPI    20 year old female with a history of abdominal pain, GERD, headache, chronic nausea history of gastroparesis followed by Dr. Jarold Motto, presents with concern of abdominal pain, nausea and vomiting. Patient was seen in the emergency department July 31 for the same. Reports stabbing, burning pain in the left upper quadrant. It is primarily located in this area, however it radiates over to the left flank. Every time she tries to eat or drink, the pain worsens and she has emesis. Denies hematemesis. Reports that yellow in color. Pain is 10/10 in severity.  Similar to pain she has had with prior gastroparesis. Has been taking Reglan at home, however is unable to keep it down and continued to vomit.  Past Medical History:  Diagnosis Date  . Abdominal pain   . Fatigue   . Gastroparesis   . GERD (gastroesophageal reflux disease)   . Headache(784.0)   . Helicobacter pylori (H. pylori)   . Hoarseness   . Hypertension    high blood pressure readings  . Nausea vomiting and diarrhea   . Night sweats   . Obesity   . Rectal bleeding   . Ulcer   . Wears glasses     Patient Active Problem List   Diagnosis Date Noted  . Chronic nausea - hx gastroparesis, followed by Dr. Jarold Motto in GI 12/25/2013  . Dysmenorrhea, irr vaginal bleeding - followed by Dr. Henderson Cloud in gyn 12/25/2013  . Obesity, morbid (HCC) 12/25/2013    Past Surgical History:  Procedure Laterality Date  . ESOPHAGOGASTRODUODENOSCOPY  02/2011  . LAPAROSCOPIC CHOLECYSTECTOMY  05/2011  . MOUTH SURGERY  5 years ago  . TONSILLECTOMY AND ADENOIDECTOMY  2002    OB History    No data available       Home Medications    Prior to Admission medications   Medication  Sig Start Date End Date Taking? Authorizing Provider  metoCLOPramide (REGLAN) 10 MG tablet Take 0.5 tablets (5 mg total) by mouth every 6 (six) hours. 07/06/16 08/06/16  Marcelina Morel, MD  norgestimate-ethinyl estradiol (ORTHO-CYCLEN,SPRINTEC,PREVIFEM) 0.25-35 MG-MCG tablet Take 1 tablet by mouth daily.    Historical Provider, MD  omeprazole (PRILOSEC) 20 MG capsule Take 1 capsule (20 mg total) by mouth daily. 07/06/16 08/06/16  Marcelina Morel, MD  ondansetron (ZOFRAN ODT) 4 MG disintegrating tablet Take 1 tablet (4 mg total) by mouth every 8 (eight) hours as needed for nausea or vomiting. 07/11/16   Alvira Monday, MD  potassium chloride (K-DUR) 10 MEQ tablet Take 1 tablet (10 mEq total) by mouth 2 (two) times daily. 07/11/16 07/13/16  Alvira Monday, MD    Family History Family History  Problem Relation Age of Onset  . Hypertension Mother   . Hyperlipidemia Mother   . Alcoholism Father   . Arthritis Father     paternal grandparents  . Diabetes Maternal Grandmother   . Arthritis Maternal Grandmother   . Heart disease Maternal Grandmother   . Diabetes Maternal Aunt   . Alcoholism      maternal grandparents  . Hyperlipidemia      all four grandparents  . Stroke    . Mental illness      father's side  .  Heart disease Paternal Grandfather   . Colon cancer Neg Hx     Social History Social History  Substance Use Topics  . Smoking status: Never Smoker  . Smokeless tobacco: Never Used  . Alcohol use No     Allergies   Review of patient's allergies indicates no known allergies.   Review of Systems Review of Systems  Constitutional: Negative for fever.  HENT: Negative for sore throat.   Eyes: Negative for visual disturbance.  Respiratory: Negative for cough and shortness of breath.   Cardiovascular: Negative for chest pain.  Gastrointestinal: Positive for abdominal pain, nausea and vomiting. Negative for constipation and diarrhea.  Genitourinary: Positive for flank pain.  Negative for difficulty urinating.  Musculoskeletal: Negative for back pain and neck pain.  Skin: Negative for rash.  Neurological: Negative for syncope and headaches.     Physical Exam Updated Vital Signs BP 129/90 (BP Location: Right Arm)   Pulse 60   Temp 97.5 F (36.4 C) (Oral)   Resp 20   LMP 06/29/2016   SpO2 100%   Physical Exam  Constitutional: She is oriented to person, place, and time. She appears well-developed and well-nourished. She appears distressed (patient crying, rocking back and forth in pain on initial evaluation).  HENT:  Head: Normocephalic and atraumatic.  Eyes: Conjunctivae and EOM are normal.  Neck: Normal range of motion.  Cardiovascular: Normal rate, regular rhythm, normal heart sounds and intact distal pulses.  Exam reveals no gallop and no friction rub.   No murmur heard. Pulmonary/Chest: Effort normal and breath sounds normal. No respiratory distress. She has no wheezes. She has no rales.  Abdominal: Soft. She exhibits no distension. There is tenderness (LUQ). There is CVA tenderness (reports bilateral). There is no rigidity, no guarding, no tenderness at McBurney's point and negative Murphy's sign.  Musculoskeletal: She exhibits no edema or tenderness.  Neurological: She is alert and oriented to person, place, and time.  Skin: Skin is warm and dry. No rash noted. She is not diaphoretic. No erythema.  Nursing note and vitals reviewed.    ED Treatments / Results  Labs (all labs ordered are listed, but only abnormal results are displayed) Labs Reviewed  COMPREHENSIVE METABOLIC PANEL - Abnormal; Notable for the following:       Result Value   Potassium 3.2 (*)    CO2 20 (*)    Glucose, Bld 107 (*)    BUN <5 (*)    Total Protein 8.8 (*)    AST 77 (*)    ALT 103 (*)    All other components within normal limits  URINALYSIS, ROUTINE W REFLEX MICROSCOPIC (NOT AT [facility]) - Abnormal; Notable for the following:    Color, Urine AMBER (*)    APPearance  CLOUDY (*)    Hgb urine dipstick TRACE (*)    Bilirubin Urine SMALL (*)    Ketones, ur 40 (*)    All other components within normal limits  CBC WITH DIFFERENTIAL/PLATELET - Abnormal; Notable for the following:    WBC 17.8 (*)    RBC 5.33 (*)    MCV 69.8 (*)    MCH 22.5 (*)    RDW 17.8 (*)    Platelets 554 (*)    Neutro Abs 14.6 (*)    All other components within normal limits  LIPASE, BLOOD  URINE MICROSCOPIC-ADD ON  I-STAT BETA HCG BLOOD, ED ([facility], [facility], [facility] ONLY)    EKG  EKG Interpretation None       Radiology  No results found.  Procedures Procedures (including critical care time)  Medications Ordered in ED Medications  ondansetron (ZOFRAN) injection 4 mg (4 mg Intravenous Given 07/11/16 2038)  fentaNYL (SUBLIMAZE) injection 50 mcg (50 mcg Intravenous Given 07/11/16 2038)  sodium chloride 0.9 % bolus 1,000 mL (0 mLs Intravenous Stopped 07/11/16 2157)  haloperidol lactate (HALDOL) injection 2 mg (2 mg Intravenous Given 07/11/16 2055)  ketorolac (TORADOL) 30 MG/ML injection 30 mg (30 mg Intravenous Given 07/11/16 2157)     Initial Impression / Assessment and Plan / ED Course  I have reviewed the triage vital signs and the nursing notes.  Pertinent labs & imaging results that were available during my care of the patient were reviewed by me and considered in my medical decision making (see chart for details).  Clinical Course   20 year old female with a history of abdominal pain, GERD, headache, chronic nausea history of gastroparesis followed by Dr. Jarold Motto, presents with concern of abdominal pain, nausea and vomiting. Patient was seen in the emergency department July 31 for the same. Patient reports that the pain, nausea and vomiting are consistent with her prior episodes of gastroparesis. Patient with severe pain on arrival to the emergency department, and was given fentanyl for pain, and Haldol for nausea with suspected gastroparesis. Her abdominal exam is benign, no sign of  cholecystitis, appendicitis, diverticulitis. Reports pain is worse in the left upper quadrant, consistent with likely gastritis or peptic ulcer disease. There is no peritonitis to suggest perforation.  Patient's labs show leukocytosis, which is not specific. Her other labs show mild hypokalemia.  Given patient's colicky type pain, did initially order a renal ultrasound and x-ray, however while we're waiting for these, patient states that she needs to leave. Overall, given episode consistent with prior episodes of pain and gastroparesis, feel this is the most likely etiology, and discussed reasons for patient to return. She was given a prescription for ODT Zofran, as well as potassium. Patient discharged in stable condition with understanding of reasons to return.   Final Clinical Impressions(s) / ED Diagnoses   Final diagnoses:  Epigastric pain  Non-intractable vomiting with nausea, vomiting of unspecified type    New Prescriptions Discharge Medication List as of 07/11/2016 10:16 PM    START taking these medications   Details  ondansetron (ZOFRAN ODT) 4 MG disintegrating tablet Take 1 tablet (4 mg total) by mouth every 8 (eight) hours as needed for nausea or vomiting., Starting Sat 07/11/2016, Print         Alvira Monday, MD 07/12/16 [phone]

## 2016-07-13 ENCOUNTER — Telehealth: Payer: Self-pay | Admitting: Family Medicine

## 2016-07-13 DIAGNOSIS — Z7689 Persons encountering health services in other specified circumstances: Secondary | ICD-10-CM

## 2016-07-13 MED FILL — ONDANSETRON ODT 4 MG TABLET: 4 | 7 days supply | Qty: 20 | Fill #0

## 2016-07-13 MED FILL — KLOR-CON M10 TABLET: 10 | 2 days supply | Qty: 4 | Fill #0

## 2016-07-13 NOTE — Telephone Encounter (Signed)
Patient has an appt on 8/31.  I called [facility] and spoke with Britta MccreedyBarbara and she stated they do not have any earlier appts as they are booking out until October; however she will put the pt on a cancellation list and call her if they have anything sooner.

## 2016-07-13 NOTE — Telephone Encounter (Signed)
Please schedule 30 min appt tomorrow if so sick can't work - we need to re-eval and possible get further studies and talk with GI if needed. Thanks.

## 2016-07-13 NOTE — Telephone Encounter (Signed)
Pt would like to know if she needs to come in to have her FMLA forms filled out.  Pt is having severe stomach pain today refused [employer].

## 2016-07-13 NOTE — Telephone Encounter (Signed)
See if we can bump up GI appt. - let them know she has been in the emergency room twice and per recent ER notes concern for gastritis or ulcer. Let me know when they can see her.

## 2016-07-13 NOTE — Telephone Encounter (Signed)
I called the pt and scheduled an appt for tomorrow at 1:15pm. 

## 2016-07-14 ENCOUNTER — Observation Stay (HOSPITAL_COMMUNITY)
Admission: EM | Admit: 2016-07-14 | Discharge: 2016-07-16 | Disposition: A | Discharge status: Home / Self Care | Payer: 59 | Attending: Internal Medicine | Admitting: Internal Medicine

## 2016-07-14 ENCOUNTER — Emergency Department (HOSPITAL_COMMUNITY): Payer: 59

## 2016-07-14 ENCOUNTER — Ambulatory Visit (INDEPENDENT_AMBULATORY_CARE_PROVIDER_SITE_OTHER): Payer: 59 | Admitting: Family Medicine

## 2016-07-14 ENCOUNTER — Encounter: Payer: Self-pay | Admitting: Family Medicine

## 2016-07-14 ENCOUNTER — Encounter (HOSPITAL_COMMUNITY): Payer: Self-pay | Admitting: Emergency Medicine

## 2016-07-14 VITALS — BP 116/80 | HR 82 | Temp 98.3°F | Ht 65.0 in | Wt 287.9 lb

## 2016-07-14 DIAGNOSIS — R1111 Vomiting without nausea: Secondary | ICD-10-CM

## 2016-07-14 DIAGNOSIS — R030 Elevated blood-pressure reading, without diagnosis of hypertension: Secondary | ICD-10-CM | POA: Diagnosis not present

## 2016-07-14 DIAGNOSIS — R1115 Cyclical vomiting syndrome unrelated to migraine: Secondary | ICD-10-CM

## 2016-07-14 DIAGNOSIS — Z79899 Other long term (current) drug therapy: Secondary | ICD-10-CM | POA: Insufficient documentation

## 2016-07-14 DIAGNOSIS — R7401 Elevation of levels of liver transaminase levels: Secondary | ICD-10-CM

## 2016-07-14 DIAGNOSIS — Z6841 Body Mass Index (BMI) 40.0 and over, adult: Secondary | ICD-10-CM | POA: Insufficient documentation

## 2016-07-14 DIAGNOSIS — R1013 Epigastric pain: Secondary | ICD-10-CM

## 2016-07-14 DIAGNOSIS — D509 Iron deficiency anemia, unspecified: Secondary | ICD-10-CM | POA: Insufficient documentation

## 2016-07-14 DIAGNOSIS — R740 Nonspecific elevation of levels of transaminase and lactic acid dehydrogenase [LDH]: Secondary | ICD-10-CM | POA: Insufficient documentation

## 2016-07-14 DIAGNOSIS — R112 Nausea with vomiting, unspecified: Secondary | ICD-10-CM

## 2016-07-14 DIAGNOSIS — E860 Dehydration: Secondary | ICD-10-CM | POA: Insufficient documentation

## 2016-07-14 DIAGNOSIS — Z793 Long term (current) use of hormonal contraceptives: Secondary | ICD-10-CM | POA: Insufficient documentation

## 2016-07-14 DIAGNOSIS — F1290 Cannabis use, unspecified, uncomplicated: Secondary | ICD-10-CM

## 2016-07-14 DIAGNOSIS — K219 Gastro-esophageal reflux disease without esophagitis: Secondary | ICD-10-CM | POA: Insufficient documentation

## 2016-07-14 DIAGNOSIS — K30 Functional dyspepsia: Secondary | ICD-10-CM | POA: Insufficient documentation

## 2016-07-14 DIAGNOSIS — K3184 Gastroparesis: Secondary | ICD-10-CM

## 2016-07-14 DIAGNOSIS — R1110 Vomiting, unspecified: Secondary | ICD-10-CM

## 2016-07-14 DIAGNOSIS — R1012 Left upper quadrant pain: Secondary | ICD-10-CM | POA: Diagnosis not present

## 2016-07-14 DIAGNOSIS — G43A1 Cyclical vomiting, intractable: Principal | ICD-10-CM | POA: Insufficient documentation

## 2016-07-14 LAB — I-STAT BETA HCG BLOOD, ED (MC, WL, AP ONLY): I-stat hCG, quantitative: <5 m[IU]/mL (ref <5)

## 2016-07-14 LAB — COMPREHENSIVE METABOLIC PANEL
ALBUMIN: 4.4 g/dL (ref 3.5–5.0)
ALT: 72 U/L — ABNORMAL HIGH (ref 14–54)
AST: 59 U/L — AB (ref 15–41)
Alkaline Phosphatase: 103 U/L (ref 38–126)
Anion gap: 12 (ref 5–15)
CHLORIDE: 104 mmol/L (ref 101–111)
CO2: 22 mmol/L (ref 22–32)
Calcium: 9.9 mg/dL (ref 8.9–10.3)
Creatinine, Ser: 0.65 mg/dL (ref 0.44–1.00)
GFR calc Af Amer: >60 mL/min (ref >60)
GFR calc non Af Amer: >60 mL/min (ref >60)
GLUCOSE: 83 mg/dL (ref 65–99)
POTASSIUM: 4.2 mmol/L (ref 3.5–5.1)
Sodium: 138 mmol/L (ref 135–145)
Total Bilirubin: 0.7 mg/dL (ref 0.3–1.2)
Total Protein: 8.3 g/dL — ABNORMAL HIGH (ref 6.5–8.1)

## 2016-07-14 LAB — CERVICOVAGINAL ANCILLARY ONLY
CHLAMYDIA, DNA PROBE: NEGATIVE
NEISSERIA GONORRHEA: NEGATIVE
Trichomonas: NEGATIVE

## 2016-07-14 LAB — CBC WITH DIFFERENTIAL/PLATELET
BASOS ABS: 0 10*3/uL (ref 0.0–0.1)
Basophils Relative: 0 %
Eosinophils Absolute: 0.1 10*3/uL (ref 0.0–0.7)
Eosinophils Relative: 1 %
HEMATOCRIT: 40 % (ref 36.0–46.0)
Hemoglobin: 11.3 g/dL — ABNORMAL LOW (ref 12.0–15.0)
LYMPHS ABS: 2.6 10*3/uL (ref 0.7–4.0)
Lymphocytes Relative: 20 %
MCH: 20.2 pg — ABNORMAL LOW (ref 26.0–34.0)
MCHC: 28.3 g/dL — ABNORMAL LOW (ref 30.0–36.0)
MCV: 71.6 fL — ABNORMAL LOW (ref 78.0–100.0)
MONO ABS: 0.6 10*3/uL (ref 0.1–1.0)
MONOS PCT: 5 %
NEUTROS PCT: 74 %
Neutro Abs: 9.6 10*3/uL — ABNORMAL HIGH (ref 1.7–7.7)
PLATELETS: ADEQUATE 10*3/uL (ref 150–400)
RBC: 5.59 MIL/uL — AB (ref 3.87–5.11)
RDW: 18 % — AB (ref 11.5–15.5)
SMEAR REVIEW: ADEQUATE
WBC: 12.9 10*3/uL — AB (ref 4.0–10.5)

## 2016-07-14 LAB — URINALYSIS, ROUTINE W REFLEX MICROSCOPIC
GLUCOSE, UA: NEGATIVE mg/dL
Ketones, ur: >80 mg/dL — AB
LEUKOCYTES UA: NEGATIVE
Nitrite: NEGATIVE
PH: 6.5 (ref 5.0–8.0)
PROTEIN: NEGATIVE mg/dL

## 2016-07-14 LAB — RAPID URINE DRUG SCREEN, HOSP PERFORMED
AMPHETAMINES: NOT DETECTED
Barbiturates: NOT DETECTED
Benzodiazepines: NOT DETECTED
COCAINE: NOT DETECTED
OPIATES: NOT DETECTED
TETRAHYDROCANNABINOL: POSITIVE — AB

## 2016-07-14 LAB — LIPASE, BLOOD: Lipase: 23 U/L (ref 11–51)

## 2016-07-14 LAB — URINE MICROSCOPIC-ADD ON

## 2016-07-14 MED ORDER — ACETAMINOPHEN 325 MG PO TABS: 650.0000 mg | ORAL_TABLET | Freq: Four times a day (QID) | ORAL | Status: DC | PRN

## 2016-07-14 MED ORDER — METOCLOPRAMIDE HCL 5 MG/ML IJ SOLN
10.0000 mg | Freq: Once | INTRAMUSCULAR | Status: AC
  Administered 2016-07-14: 10 mg via INTRAVENOUS
  Filled 2016-07-14: qty 2

## 2016-07-14 MED ORDER — ONDANSETRON HCL 4 MG PO TABS: 4.0000 mg | ORAL_TABLET | Freq: Four times a day (QID) | ORAL | Status: DC | PRN

## 2016-07-14 MED ORDER — METOCLOPRAMIDE HCL 5 MG/ML IJ SOLN
5.0000 mg | Freq: Four times a day (QID) | INTRAMUSCULAR | Status: DC
Start: 2016-07-14 — End: 2016-07-16
  Administered 2016-07-14 – 2016-07-16 (×7): 5 mg via INTRAVENOUS
  Filled 2016-07-14 (×7): qty 2

## 2016-07-14 MED ORDER — ZOLPIDEM TARTRATE 5 MG PO TABS
5.0000 mg | ORAL_TABLET | Freq: Every evening | ORAL | Status: DC | PRN
  Administered 2016-07-14 – 2016-07-15 (×2): 5 mg via ORAL
  Filled 2016-07-14 (×2): qty 1

## 2016-07-14 MED ORDER — IOPAMIDOL (ISOVUE-300) INJECTION 61%
INTRAVENOUS | Status: AC
  Administered 2016-07-14: 100 mL
  Filled 2016-07-14: qty 100

## 2016-07-14 MED ORDER — ONDANSETRON HCL 4 MG/2ML IJ SOLN
4.0000 mg | Freq: Four times a day (QID) | INTRAMUSCULAR | Status: DC | PRN
  Filled 2016-07-14: qty 2

## 2016-07-14 MED ORDER — ONDANSETRON HCL 4 MG/2ML IJ SOLN: 4.0000 mg | Freq: Three times a day (TID) | INTRAMUSCULAR | Status: DC | PRN

## 2016-07-14 MED ORDER — ONDANSETRON HCL 4 MG/2ML IJ SOLN
4.0000 mg | Freq: Once | INTRAMUSCULAR | Status: AC
  Administered 2016-07-14: 4 mg via INTRAVENOUS
  Filled 2016-07-14: qty 2

## 2016-07-14 MED ORDER — SODIUM CHLORIDE 0.9 % IV SOLN
1000.0000 mL | INTRAVENOUS | Status: DC
  Administered 2016-07-14 – 2016-07-16 (×5): 1000 mL via INTRAVENOUS

## 2016-07-14 MED ORDER — ENOXAPARIN SODIUM 40 MG/0.4ML ~~LOC~~ SOLN
40.0000 mg | SUBCUTANEOUS | Status: DC
  Administered 2016-07-14 – 2016-07-15 (×2): 40 mg via SUBCUTANEOUS
  Filled 2016-07-14 (×2): qty 0.4

## 2016-07-14 MED ORDER — FAMOTIDINE IN NACL 20-0.9 MG/50ML-% IV SOLN
20.0000 mg | Freq: Two times a day (BID) | INTRAVENOUS | Status: DC
  Administered 2016-07-14 – 2016-07-16 (×4): 20 mg via INTRAVENOUS
  Filled 2016-07-14 (×5): qty 50

## 2016-07-14 MED ORDER — SODIUM CHLORIDE 0.9 % IV SOLN
1000.0000 mL | Freq: Once | INTRAVENOUS | Status: AC
  Administered 2016-07-14: 1000 mL via INTRAVENOUS

## 2016-07-14 MED ORDER — ACETAMINOPHEN 650 MG RE SUPP: 650.0000 mg | Freq: Four times a day (QID) | RECTAL | Status: DC | PRN

## 2016-07-14 MED ORDER — SODIUM CHLORIDE 0.9 % IV SOLN
INTRAVENOUS | Status: DC
  Filled 2016-07-14: qty 1000

## 2016-07-14 NOTE — Progress Notes (Signed)
HPI:  Miranda Hawkins is a 20 year old morbidly obese African-American female with a past medical history significant for chronic recurrent nausea and vomiting, gastroparesis, acid reflux and dysmenorrhea here for a follow-up visit regarding her gastrointestinal symptoms. She has been to the emergency room twice in the last 10 days. She has had extensive lab work which seems to suggest some dehydration and she has had a mildly elevated white count at times. She is scheduled to see her gastroenterologist later this month.  She has not had any imaging recently. She had an extensive evaluation with GI in 2012 and 13 with abdominal ultrasound, abdominal MRI and gastric emptying testing. On review of chart she had multiple accessory spleens on the MRI. She has a history of cholecystectomy. Today she reports persistent severe nausea, and vomiting and bad pain if she eats anything. She reports she has not eaten in 1 week. She reports she can not tolerate taking the reglan or prilosec or any pills as vomits them back up.  She has tolerated liquids and has been able to hold down soup broth and water if take zofran ODTs around the clock. When she goes to work she feels weak and throws up and they send her home. Denies hematochezia, hematemesis, melena, abd pain unless eats, fevers, SOB, CP, depression.   ROS: See pertinent positives and negatives per HPI.  Past Medical History:  Diagnosis Date  . Abdominal pain   . Fatigue   . Gastroparesis   . GERD (gastroesophageal reflux disease)   . Headache(784.0)   . Helicobacter pylori (H. pylori)   . Hoarseness   . Hypertension    high blood pressure readings  . Nausea vomiting and diarrhea   . Night sweats   . Obesity   . Rectal bleeding   . Ulcer   . Wears glasses     Past Surgical History:  Procedure Laterality Date  . ESOPHAGOGASTRODUODENOSCOPY  02/2011  . LAPAROSCOPIC CHOLECYSTECTOMY  05/2011  . MOUTH SURGERY  5 years ago  . TONSILLECTOMY AND  ADENOIDECTOMY  2002    Family History  Problem Relation Age of Onset  . Hypertension Mother   . Hyperlipidemia Mother   . Alcoholism Father   . Arthritis Father     paternal grandparents  . Diabetes Maternal Grandmother   . Arthritis Maternal Grandmother   . Heart disease Maternal Grandmother   . Diabetes Maternal Aunt   . Alcoholism      maternal grandparents  . Hyperlipidemia      all four grandparents  . Stroke    . Mental illness      father's side  . Heart disease Paternal Grandfather   . Colon cancer Neg Hx     Social History   Social History  . Marital status: Single    Spouse name: N/A  . Number of children: 0  . Years of education: N/A   Occupational History  . Student     Social History Main Topics  . Smoking status: Never Smoker  . Smokeless tobacco: Never Used  . Alcohol use No  . Drug use:     Types: Marijuana  . Sexual activity: Not Asked   Other Topics Concern  . 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  metoCLOPramide (REGLAN) 10 MG tablet, Take 0.5 tablets (5 mg total) by mouth every 6 (six) hours., Disp: 62 tablet, Rfl: 0 .  norgestimate-ethinyl estradiol (ORTHO-CYCLEN,SPRINTEC,PREVIFEM) 0.25-35 MG-MCG tablet, Take 1 tablet by mouth daily., Disp: , Rfl:  .  omeprazole (PRILOSEC) 20 MG capsule, Take 1 capsule (20 mg total) by mouth daily., Disp: 31 capsule, Rfl: 0 .  ondansetron (ZOFRAN ODT) 4 MG disintegrating tablet, Take 1 tablet (4 mg total) by mouth every 8 (eight) hours as needed for nausea or vomiting., Disp: 20 tablet, Rfl: 0 .  potassium chloride (K-DUR) 10 MEQ tablet, Take 1 tablet (10 mEq total) by mouth 2 (two) times daily., Disp: 4 tablet, Rfl:  0  EXAM:  Vitals:   07/14/16 1316  BP: 116/80  Pulse: 82  Temp: 98.3 F (36.8 C)    Body mass index is 47.91 kg/m.  GENERAL: vitals reviewed and listed above, alert, oriented, appears well hydrated and in no acute distress  HEENT: atraumatic, conjunttiva clear, no obvious abnormalities on inspection of external nose and ears, dry lips and tongue  NECK: no obvious masses on inspection  LUNGS: clear to auscultation bilaterally, no wheezes, rales or rhonchi, good air movement  CV: HRRR, no peripheral edema  ABD: BS hypoactive, soft, nttp  MS: moves all extremities without noticeable abnormality  PSYCH: pleasant and cooperative, no obvious depression or anxiety  ASSESSMENT AND PLAN:  Discussed the following assessment and plan: More than 50% of over 30 minutes spent in total in caring for this patient was spent face-to-face with the patient, counseling and/or coordinating care.    Intractable vomiting with nausea, unspecified vomiting type  Epigastric pain  -discussed with Dr. Judge Stall, GI whom advised hospital for admission to r/o other and get symptoms under control - he plans to notify on call GI doc -spoke with Dr. Patria Mane in the ER and he is aware  -Patient advised to return or notify a doctor immediately if symptoms worsen or persist or new concerns arise.  There are no Patient Instructions on file for this visit.  Kriste Basque R., DO

## 2016-07-14 NOTE — ED Notes (Signed)
Pt back from CT at this time 

## 2016-07-14 NOTE — ED Notes (Signed)
PA at bedside.

## 2016-07-14 NOTE — ED Provider Notes (Signed)
I was contacted the patient's primary care physician Dr. Kriste BasqueHannah Kim reports the patient's had ongoing nausea vomiting and inability to keep foods and fluids down this week.  She discussed the case with Dr. Evette CristalGanem of gastroenterology who recommends workup in the emergency department followed by admission and GI consultation.  He will pass on the information to Dr. Leone PayorGessner who is on-call today   Azalia BilisKevin Mickie Kozikowski, MD 07/14/16 [phone]

## 2016-07-14 NOTE — Progress Notes (Signed)
Pt admitted to 5 west room 37 from ED. Pt A&Ox4, all questions answered and Pt stable.

## 2016-07-14 NOTE — ED Triage Notes (Signed)
Pt sent here for eval of nausea/emesis x 1 month. PCP sts pt is failing outpatient and recommends admission through GI. Pt reports unable to eat, drink. Pt tearful in triage.

## 2016-07-14 NOTE — ED Provider Notes (Signed)
[facility]-EMERGENCY DEPT Provider Note   CSN: [id number] Arrival date & time: 07/14/16  1418  First Provider Contact:  First MD Initiated Contact with Patient 07/14/16 1518      History   Chief Complaint Chief Complaint  Patient presents with  . Abdominal Pain  . Emesis    HPI Miranda Hawkins is a 20 y.o. female.  Patient with history of marijuana abuse, cholecystectomy, gastroparesis -- presents with two-week history of persistent vomiting, multiple ED visits, multiple PCP visits, no relief with Zofran, Reglan at home. Patient saw her primary care physician Dr. Selena BattenKim today. She called ED physician and GI regarding this patient. Feels that she requires inpatient admission for stabilization at this point. Patient has had some generalized abdominal pains which are ongoing and unchanged. She has been able to keep down a small amount of water and chicken broth. She vomits after she eats anything solid. She states that she continues to smoke marijuana to try to help with her appetite. No urinary symptoms. Patient has had 'slimy' stools but no bloody stools or diarrhea. The onset of this condition was acute. The course is constant. Aggravating factors: none. Alleviating factors: none.       Past Medical History:  Diagnosis Date  . Abdominal pain   . Fatigue   . Gastroparesis   . GERD (gastroesophageal reflux disease)   . Headache(784.0)   . Helicobacter pylori (H. pylori)   . Hoarseness   . Hypertension    high blood pressure readings  . Nausea vomiting and diarrhea   . Night sweats   . Obesity   . Rectal bleeding   . Ulcer   . Wears glasses     Patient Active Problem List   Diagnosis Date Noted  . Chronic nausea - hx gastroparesis, followed by Dr. Jarold MottoPatterson in GI 12/25/2013  . Dysmenorrhea, irr vaginal bleeding - followed by Dr. Henderson CloudHorvath in gyn 12/25/2013  . Obesity, morbid (HCC) 12/25/2013    Past Surgical History:  Procedure Laterality Date  . ESOPHAGOGASTRODUODENOSCOPY   02/2011  . LAPAROSCOPIC CHOLECYSTECTOMY  05/2011  . MOUTH SURGERY  5 years ago  . TONSILLECTOMY AND ADENOIDECTOMY  2002    OB History    No data available       Home Medications    Prior to Admission medications   Medication Sig Start Date End Date Taking? Authorizing Provider  metoCLOPramide (REGLAN) 10 MG tablet Take 0.5 tablets (5 mg total) by mouth every 6 (six) hours. 07/06/16 08/06/16  Marcelina MorelMichael Supples, MD  norgestimate-ethinyl estradiol (ORTHO-CYCLEN,SPRINTEC,PREVIFEM) 0.25-35 MG-MCG tablet Take 1 tablet by mouth daily.    Historical Provider, MD  omeprazole (PRILOSEC) 20 MG capsule Take 1 capsule (20 mg total) by mouth daily. 07/06/16 08/06/16  Marcelina MorelMichael Supples, MD  ondansetron (ZOFRAN ODT) 4 MG disintegrating tablet Take 1 tablet (4 mg total) by mouth every 8 (eight) hours as needed for nausea or vomiting. 07/11/16   Alvira MondayErin Schlossman, MD  potassium chloride (K-DUR) 10 MEQ tablet Take 1 tablet (10 mEq total) by mouth 2 (two) times daily. 07/11/16 07/13/16  Alvira MondayErin Schlossman, MD    Family History Family History  Problem Relation Age of Onset  . Hypertension Mother   . Hyperlipidemia Mother   . Alcoholism Father   . Arthritis Father     paternal grandparents  . Diabetes Maternal Grandmother   . Arthritis Maternal Grandmother   . Heart disease Maternal Grandmother   . Diabetes Maternal Aunt   . Alcoholism  maternal grandparents  . Hyperlipidemia      all four grandparents  . Stroke    . Mental illness      father's side  . Heart disease Paternal Grandfather   . Colon cancer Neg Hx     Social History Social History  Substance Use Topics  . Smoking status: Never Smoker  . Smokeless tobacco: Never Used  . Alcohol use No     Allergies   Review of patient's allergies indicates no known allergies.   Review of Systems Review of Systems  Constitutional: Negative for fever.  HENT: Negative for rhinorrhea and sore throat.   Eyes: Negative for redness.  Respiratory:  Negative for cough.   Cardiovascular: Negative for chest pain.  Gastrointestinal: Positive for abdominal pain, nausea and vomiting. Negative for blood in stool and diarrhea.  Genitourinary: Negative for dysuria.  Musculoskeletal: Negative for myalgias.  Skin: Negative for rash.  Neurological: Negative for headaches.     Physical Exam Updated Vital Signs BP 144/92 (BP Location: Left Arm)   Pulse 60   Temp 98.4 F (36.9 C) (Oral)   Resp 18   Ht 5\' 6"  (1.676 m)   Wt 130.2 kg   LMP 06/29/2016 (Approximate)   SpO2 99%   BMI 46.32 kg/m   Physical Exam  Constitutional: She appears well-developed and well-nourished. She appears distressed (tearful).  HENT:  Head: Normocephalic and atraumatic.  Right Ear: External ear normal.  Left Ear: External ear normal.  Nose: Nose normal.  Mouth/Throat: Oropharynx is clear and moist.  Eyes: Conjunctivae are normal. Right eye exhibits no discharge. Left eye exhibits no discharge.  Neck: Normal range of motion. Neck supple.  Cardiovascular: Normal rate, regular rhythm and normal heart sounds.  Exam reveals no friction rub.   No murmur heard. Pulmonary/Chest: Effort normal and breath sounds normal. No respiratory distress. She has no wheezes. She has no rales.  Abdominal: Soft. She exhibits no mass. There is no tenderness. There is no rebound and no guarding.  Neurological: She is alert.  Skin: Skin is warm and dry.  Psychiatric: Her mood appears anxious.  Nursing note and vitals reviewed.    ED Treatments / Results  Labs (all labs ordered are listed, but only abnormal results are displayed) Labs Reviewed  CBC WITH DIFFERENTIAL/PLATELET - Abnormal; Notable for the following:       Result Value   WBC 12.9 (*)    RBC 5.59 (*)    Hemoglobin 11.3 (*)    MCV 71.6 (*)    MCH 20.2 (*)    MCHC 28.3 (*)    RDW 18.0 (*)    Neutro Abs 9.6 (*)    All other components within normal limits  COMPREHENSIVE METABOLIC PANEL - Abnormal; Notable for  the following:    BUN <5 (*)    Total Protein 8.3 (*)    AST 59 (*)    ALT 72 (*)    All other components within normal limits  LIPASE, BLOOD  URINALYSIS, ROUTINE W REFLEX MICROSCOPIC (NOT AT [facility])  URINE RAPID DRUG SCREEN, HOSP PERFORMED  I-STAT BETA HCG BLOOD, ED ([facility], [facility], [facility] ONLY)    EKG  EKG Interpretation None       Radiology Ct Abdomen Pelvis W Contrast  Result Date: 07/14/2016 CLINICAL DATA:  20 YOF. States she has LUQ pain, with nausea and vomiting, intermittently over past 3 months. Symptoms have worsened the past 2 weeks. History of cholecystectomy. Neg BHCG. Isovue 300 127ml^100mL ISOVUE-300 IOPAMIDOL (ISOVUE-300)  INJECTION 61% EXAM: CT ABDOMEN AND PELVIS WITH CONTRAST TECHNIQUE: Multidetector CT imaging of the abdomen and pelvis was performed using the standard protocol following bolus administration of intravenous contrast. CONTRAST:  ISOVUE-300 IOPAMIDOL (ISOVUE-300) INJECTION 61% COMPARISON:  04/29/2012 MRCP FINDINGS: Lower chest: There are discrete ground-glass nodules in the lung bases bilaterally. These are identified in the right middle lobe and both lower lobes. Largest measures 1.7 cm in the right lower lobe on image 7 of series 4. There are no pleural effusions or discrete consolidations. Heart size is normal. No imaged pericardial effusion or significant coronary artery calcifications. Hepatobiliary: The liver is normal in appearance. Status post cholecystectomy. Pancreas: Pancreas is normal in appearance. Spleen: Homogeneous and normal in appearance. In the left anterior central abdomen, numerous solid enhancing masses are identified, consistent with accessory spleens. The largest of these is 3.7 x 4.1 cm compared to 3.2 x 4.1 cm on prior MRI from 2013. Renal/Adrenal: Adrenal glands are normal in appearance. There is normal enhancement of both kidneys. No hydronephrosis or renal mass. Gastrointestinal tract: The stomach and small bowel loops appear normal. The  appendix is well seen and has a normal appearance. The colonic loops are normal in appearance. Reproductive/Pelvis: Uterus is present. No adnexal mass. No free pelvic fluid. Vascular/Lymphatic: No evidence for aortic aneurysm. No retroperitoneal or mesenteric adenopathy. Musculoskeletal/Abdominal wall: Artifact from left nipple piercing. Visualized osseous structures have a normal appearance. Other: none IMPRESSION: 1. Sub solid nodules in the lung bases bilaterally. Considerations include infectious (viral or fungal) or inflammatory process. Drug toxicity, hypersensitivity pneumonitis, and rarely, metastases can have this appearance. 2. Initial follow-up with chest CT at 6-12 months is recommended to confirm persistence. If persistent, repeat CT is recommended every 2 years until 5 years of stability has been established. This recommendation follows the consensus statement: Guidelines for Management of Incidental Pulmonary Nodules Detected on CT Images:From the Fleischner Society 2017; published online before print (10.1148/radiol.[id number]). 3. Status post cholecystectomy. 4. Numerous accessory spleens, stable compared with prior MRI exam. 5. Normal appendix. Electronically Signed   By: Norva Pavlov M.D.   On: 07/14/2016 16:45    Procedures Procedures (including critical care time)  Medications Ordered in ED Medications  0.9 %  sodium chloride infusion (not administered)    Followed by  0.9 %  sodium chloride infusion (1,000 mLs Intravenous New Bag/Given 07/14/16 1542)  ondansetron (ZOFRAN) injection 4 mg (4 mg Intravenous Given 07/14/16 1541)  metoCLOPramide (REGLAN) injection 10 mg (10 mg Intravenous Given 07/14/16 1541)  iopamidol (ISOVUE-300) 61 % injection (100 mLs  Contrast Given 07/14/16 1604)     Initial Impression / Assessment and Plan / ED Course  I have reviewed the triage vital signs and the nursing notes.  Pertinent labs & imaging results that were available during my care of the  patient were reviewed by me and considered in my medical decision making (see chart for details).  Clinical Course   3:36 PM Patient seen and examined. Reviewed PCP notes, previous ED visit. Will need admission when labs return.   Vital signs reviewed and are as follows: BP 144/92 (BP Location: Left Arm)   Pulse 60   Temp 98.4 F (36.9 C) (Oral)   Resp 18   Ht 5\' 6"  (1.676 m)   Wt 130.2 kg   LMP 06/29/2016 (Approximate)   SpO2 99%   BMI 46.32 kg/m   5:21 PM CT ordered previously by Dr. Patria Mane after discussion with PCP -- shows pulmonary nodules only.  Patient updated and is stable. Sleepy after Reglan.  Spoke with Dr. Arbutus Leas, who will see and admit.    Final Clinical Impressions(s) / ED Diagnoses   Final diagnoses:  Gastroparesis  Intractable vomiting with nausea, vomiting of unspecified type   Admit,   New Prescriptions New Prescriptions   No medications on file     Renne Crigler, PA-C 07/14/16 1722    Loren Racer, MD 07/16/16 1247

## 2016-07-14 NOTE — Progress Notes (Signed)
Pre visit review using our clinic review tool, if applicable. No additional management support is needed unless otherwise documented below in the visit note. 

## 2016-07-14 NOTE — H&P (Addendum)
History and Physical  LOLITHA TORTORA Hawkins:[id number] DOB: 03/15/1996 DOA: 07/14/2016   PCP: Terressa Koyanagi., DO   Outpatient Specialists:   Patient coming from: Home  Chief Complaint: N/V  HPI:  Miranda Hawkins is a 20 y.o. female with medical history of elevated blood pressure without diagnosis of hypertension, GERD, gastroparesis presents with 3 month history of nausea and vomiting that has worsened over the past 2 weeks. The patient states that she has been vomiting up to 3 times per day for the past 2 weeks, although she has not had any emesis on 07/14/2016, the day of admission. The patient has had 2 ED visits in the past week, but during her last ED visit, the patient eloped before diagnostic radiographic studies can be done.  She denies any hematemesis, diarrhea, hematochezia, melena. There are no fevers,chest discomfort, shortness of breath, dysuria, hematuria. She denies any recent sick contacts, and she has not seen any raw or undercooked foods. There has not been any travels. The patient has been taking over the counter "Nite Quil" to help her sleep.  The patient has been smoking marijuana on a daily basis for the past 1-2 months hoping that it would stimulate her appetite. She denies any illegal drugs. She denies any alcohol or tobacco. She denies any NSAIDs. The patient went to visit her PCP on the day of admission, and her PCP contacted the emergency department for direct admission.  In the emergency department, the patient had WBC 12.9 with hemoglobin 11.3. BMP was unremarkable. AST 59, ALT 72, PHOSPHATASE 103, TOTAL BILIRUBIN 2.7, LIPASE 23. CT OF THE ABDOMEN AND PELVIS WAS UNREMARKABLE FOR ANY ACUTE FINDINGS.  Assessment/Plan: Intractable nausea and vomiting/cyclic vomiting syndrome -Likely due to exacerbation of the patient's underlying gastroparesis in the setting of daily marijuana use -NPO except ice chips -IVF -IV reglan and pepcid -zofran prn N/V -07/14/2016 CT abdomen  and pelvis status post cholecystectomy, normal small bowel and colon, normal appendix -No emesis on day of admission -05/09/2012 gastric emptying study--severe delayed gastric emptying -04/28/2012 MRCP--normal -02/19/2011 EGD--nodular gastritis -UDS -AM cortisol -there is some anxiety and psychiatric overlay contributing to her symptoms -I have ask pt to inform and show nursing staff each and every episode of emesis  Transaminasemia -hep B surface antigen -hep c antibody -HIV -s/p cholecystectomy -trend -abd [place] would be low yield presently given above workup already done  Lung Nodules -incidental finding -will need outpt follow up  Marijuana use -cessation discussed       Past Medical History:  Diagnosis Date  . Abdominal pain   . Fatigue   . Gastroparesis   . GERD (gastroesophageal reflux disease)   . Headache(784.0)   . Helicobacter pylori (H. pylori)   . Hoarseness   . Hypertension    high blood pressure readings  . Nausea vomiting and diarrhea   . Night sweats   . Obesity   . Rectal bleeding   . Ulcer   . Wears glasses    Past Surgical History:  Procedure Laterality Date  . ESOPHAGOGASTRODUODENOSCOPY  02/2011  . LAPAROSCOPIC CHOLECYSTECTOMY  05/2011  . MOUTH SURGERY  5 years ago  . TONSILLECTOMY AND ADENOIDECTOMY  2002   Social History:  reports that she has never smoked. She has never used smokeless tobacco. She reports that she uses drugs, including Marijuana. She reports that she does not drink alcohol.   Family History  Problem Relation Age of Onset  . Hypertension Mother   .  Hyperlipidemia Mother   . Alcoholism Father   . Arthritis Father     paternal grandparents  . Diabetes Maternal Grandmother   . Arthritis Maternal Grandmother   . Heart disease Maternal Grandmother   . Diabetes Maternal Aunt   . Alcoholism      maternal grandparents  . Hyperlipidemia      all four grandparents  . Stroke    . Mental illness      father's side  .  Heart disease Paternal Grandfather   . Colon cancer Neg Hx      No Known Allergies   Prior to Admission medications   Medication Sig Start Date End Date Taking? Authorizing Provider  metoCLOPramide (REGLAN) 10 MG tablet Take 0.5 tablets (5 mg total) by mouth every 6 (six) hours. 07/06/16 08/06/16 Yes Marcelina Morel, MD  norgestimate-ethinyl estradiol (ORTHO-CYCLEN,SPRINTEC,PREVIFEM) 0.25-35 MG-MCG tablet Take 1 tablet by mouth daily.   Yes Historical Provider, MD  omeprazole (PRILOSEC) 20 MG capsule Take 1 capsule (20 mg total) by mouth daily. 07/06/16 08/06/16 Yes Marcelina Morel, MD  ondansetron (ZOFRAN ODT) 4 MG disintegrating tablet Take 1 tablet (4 mg total) by mouth every 8 (eight) hours as needed for nausea or vomiting. 07/11/16  Yes Alvira Monday, MD  potassium chloride (K-DUR) 10 MEQ tablet Take 1 tablet (10 mEq total) by mouth 2 (two) times daily. 07/11/16 07/13/16  Alvira Monday, MD    Review of Systems:  Constitutional:  No weight loss, night sweats Head&Eyes: No headache.  No vision loss.  No eye pain or scotoma ENT:  No Difficulty swallowing,Tooth/dental problems, No ear ache, post nasal drip,  Cardio-vascular:  No chest pain, Orthopnea, PND, swelling in lower extremities,  dizziness, palpitations  GI:  No   diarrhea, loss of appetite, hematochezia, melena, heartburn, indigestion, Resp:  No shortness of breath with exertion or at rest. No cough. No coughing up of blood .No wheezing.No chest wall deformity  Skin:  no rash or lesions.  GU:  no dysuria, change in color of urine, no urgency or frequency. No flank pain.  Musculoskeletal:  No joint pain or swelling. No decreased range of motion. No back pain.  Psych:  No change in mood or affect. Neurologic: No headache, no dysesthesia, no focal weakness, no vision loss. No syncope  Physical Exam: Vitals:   07/14/16 1502 07/14/16 1526  BP: 144/92   Pulse: 60   Resp: 18   Temp: 98.4 F (36.9 C)   TempSrc: Oral     SpO2: 100% 99%  Weight: 130.2 kg (287 lb)   Height:  (1.676 m)    General:  A&O x 3, NAD, nontoxic, pleasant/cooperative Head/Eye: No conjunctival hemorrhage, no icterus, [place]/AT, No nystagmus ENT:  No icterus,  No thrush, good dentition, no pharyngeal exudate Neck:  No masses, no lymphadenpathy, no bruits CV:  RRR, no rub, no gallop, no S3 Lung:  CTAB, good air movement, no wheeze, no rhonchi Abdomen: soft/NT, +BS, nondistended, no peritoneal signs Ext: No cyanosis, No rashes, No petechiae, No lymphangitis, No edema Neuro: CNII-XII intact, strength 4/5 in bilateral upper and lower extremities, no dysmetria  Labs on Admission:  Basic Metabolic Panel:  Recent Labs Lab 07/11/16 2031 07/14/16 1500  NA 138 138  K 3.2* 4.2  CL 106 104  CO2 20* 22  GLUCOSE 107* 83  BUN <5* <5*  CREATININE 0.61 0.65  CALCIUM 9.7 9.9   Liver Function Tests:  Recent Labs Lab 07/11/16 2031 07/14/16 1500  AST 77* 59*  ALT 103* 72*  ALKPHOS 117 103  BILITOT 0.6 0.7  PROT 8.8* 8.3*  ALBUMIN 4.8 4.4    Recent Labs Lab 07/11/16 2031 07/14/16 1500  LIPASE 25 23   No results for input(s): AMMONIA in the last 168 hours. CBC:  Recent Labs Lab 07/10/16 1511 07/11/16 2031 07/14/16 1500  WBC 10.0 17.8* 12.9*  NEUTROABS  --  14.6* 9.6*  HGB 11.0* 12.0 11.3*  HCT 34.0* 37.2 40.0  MCV 69.2* 69.8* 71.6*  PLT 475.0* 554* PLATELETS APPEAR ADEQUATE   Coagulation Profile: No results for input(s): INR, PROTIME in the last 168 hours. Cardiac Enzymes: No results for input(s): CKTOTAL, CKMB, CKMBINDEX, TROPONINI in the last 168 hours. BNP: Invalid input(s): POCBNP CBG: No results for input(s): GLUCAP in the last 168 hours. Urine analysis:    Component Value Date/Time   COLORURINE AMBER (A) 07/14/2016 1645   APPEARANCEUR CLEAR 07/14/2016 1645   LABSPEC >1.046 (H) 07/14/2016 1645   PHURINE 6.5 07/14/2016 1645   GLUCOSEU NEGATIVE 07/14/2016 1645   HGBUR LARGE (A) 07/14/2016 1645    BILIRUBINUR MODERATE (A) 07/14/2016 1645   BILIRUBINUR negative 07/10/2016 1501   KETONESUR >80 (A) 07/14/2016 1645   PROTEINUR NEGATIVE 07/14/2016 1645   UROBILINOGEN 1.0 07/10/2016 1501   UROBILINOGEN 1.0 01/28/2011 0206   NITRITE NEGATIVE 07/14/2016 1645   LEUKOCYTESUR NEGATIVE 07/14/2016 1645   Sepsis Labs: @LABRCNTIP (procalcitonin:4,lacticidven:4) ) Recent Results (from the past 240 hour(s))  Culture, Urine     Status: None   Collection Time: 07/10/16  3:11 PM  Result Value Ref Range Status   Organism ID, Bacteria Three or more organisms present,each greater than  Final   Organism ID, Bacteria 10,000 CFU/mL.These organisms,commonly found on  Final   Organism ID, Bacteria external and internal genitalia,are considered to  Final   Organism ID, Bacteria be colonizers.No further testing performed.  Final     Radiological Exams on Admission: Ct Abdomen Pelvis W Contrast  Result Date: 07/14/2016 CLINICAL DATA:  20 YOF. States she has LUQ pain, with nausea and vomiting, intermittently over past 3 months. Symptoms have worsened the past 2 weeks. History of cholecystectomy. Neg BHCG. Isovue 300 152ml^100mL ISOVUE-300 IOPAMIDOL (ISOVUE-300) INJECTION 61% EXAM: CT ABDOMEN AND PELVIS WITH CONTRAST TECHNIQUE: Multidetector CT imaging of the abdomen and pelvis was performed using the standard protocol following bolus administration of intravenous contrast. CONTRAST:  ISOVUE-300 IOPAMIDOL (ISOVUE-300) INJECTION 61% COMPARISON:  04/29/2012 MRCP FINDINGS: Lower chest: There are discrete ground-glass nodules in the lung bases bilaterally. These are identified in the right middle lobe and both lower lobes. Largest measures 1.7 cm in the right lower lobe on image 7 of series 4. There are no pleural effusions or discrete consolidations. Heart size is normal. No imaged pericardial effusion or significant coronary artery calcifications. Hepatobiliary: The liver is normal in appearance. Status post  cholecystectomy. Pancreas: Pancreas is normal in appearance. Spleen: Homogeneous and normal in appearance. In the left anterior central abdomen, numerous solid enhancing masses are identified, consistent with accessory spleens. The largest of these is 3.7 x 4.1 cm compared to 3.2 x 4.1 cm on prior MRI from 2013. Renal/Adrenal: Adrenal glands are normal in appearance. There is normal enhancement of both kidneys. No hydronephrosis or renal mass. Gastrointestinal tract: The stomach and small bowel loops appear normal. The appendix is well seen and has a normal appearance. The colonic loops are normal in appearance. Reproductive/Pelvis: Uterus is present. No adnexal mass. No free pelvic fluid. Vascular/Lymphatic: No evidence for aortic aneurysm. No  retroperitoneal or mesenteric adenopathy. Musculoskeletal/Abdominal wall: Artifact from left nipple piercing. Visualized osseous structures have a normal appearance. Other: none IMPRESSION: 1. Sub solid nodules in the lung bases bilaterally. Considerations include infectious (viral or fungal) or inflammatory process. Drug toxicity, hypersensitivity pneumonitis, and rarely, metastases can have this appearance. 2. Initial follow-up with chest CT at 6-12 months is recommended to confirm persistence. If persistent, repeat CT is recommended every 2 years until 5 years of stability has been established. This recommendation follows the consensus statement: Guidelines for Management of Incidental Pulmonary Nodules Detected on CT Images:From the Fleischner Society 2017; published online before print (10.1148/[id number]). 3. Status post cholecystectomy. 4. Numerous accessory spleens, stable compared with prior MRI exam. 5. Normal appendix. Electronically Signed   By: Norva PavlovElizabeth  Brown M.D.   On: 07/14/2016 16:45        Time spent:60 minutes Code Status:FULL Family Communication:  Boyfriend at bedside Disposition Plan: expect 2 day hospitalization Consults called:  none DVT Prophylaxis:  Lovenox  Bristal Steffy, DO  [facility] Pager [phone]  If 7PM-7AM, please contact night-coverage [email or web address] Password [facility] [email or web address] 07/14/2016, 5:39 PM

## 2016-07-15 ENCOUNTER — Encounter: Payer: Self-pay | Admitting: Family Medicine

## 2016-07-15 DIAGNOSIS — R740 Nonspecific elevation of levels of transaminase and lactic acid dehydrogenase [LDH]: Secondary | ICD-10-CM | POA: Diagnosis not present

## 2016-07-15 DIAGNOSIS — F1290 Cannabis use, unspecified, uncomplicated: Secondary | ICD-10-CM | POA: Diagnosis not present

## 2016-07-15 DIAGNOSIS — G43A1 Cyclical vomiting, intractable: Secondary | ICD-10-CM | POA: Diagnosis not present

## 2016-07-15 DIAGNOSIS — Z6841 Body Mass Index (BMI) 40.0 and over, adult: Secondary | ICD-10-CM | POA: Diagnosis not present

## 2016-07-15 DIAGNOSIS — K3184 Gastroparesis: Secondary | ICD-10-CM | POA: Diagnosis not present

## 2016-07-15 DIAGNOSIS — R918 Other nonspecific abnormal finding of lung field: Secondary | ICD-10-CM | POA: Insufficient documentation

## 2016-07-15 DIAGNOSIS — E860 Dehydration: Secondary | ICD-10-CM | POA: Diagnosis not present

## 2016-07-15 DIAGNOSIS — G43A0 Cyclical vomiting, not intractable: Secondary | ICD-10-CM

## 2016-07-15 DIAGNOSIS — R030 Elevated blood-pressure reading, without diagnosis of hypertension: Secondary | ICD-10-CM | POA: Diagnosis not present

## 2016-07-15 DIAGNOSIS — K219 Gastro-esophageal reflux disease without esophagitis: Secondary | ICD-10-CM | POA: Diagnosis not present

## 2016-07-15 DIAGNOSIS — K30 Functional dyspepsia: Secondary | ICD-10-CM | POA: Diagnosis not present

## 2016-07-15 LAB — CORTISOL-AM, BLOOD: Cortisol - AM: 14.6 ug/dL (ref 6.7–22.6)

## 2016-07-15 LAB — COMPREHENSIVE METABOLIC PANEL
ALBUMIN: 3.4 g/dL — AB (ref 3.5–5.0)
ALT: 55 U/L — ABNORMAL HIGH (ref 14–54)
AST: 41 U/L (ref 15–41)
Alkaline Phosphatase: 83 U/L (ref 38–126)
Anion gap: 11 (ref 5–15)
CHLORIDE: 106 mmol/L (ref 101–111)
CO2: 22 mmol/L (ref 22–32)
Calcium: 9.2 mg/dL (ref 8.9–10.3)
Creatinine, Ser: 0.66 mg/dL (ref 0.44–1.00)
GFR calc Af Amer: >60 mL/min (ref >60)
Glucose, Bld: 76 mg/dL (ref 65–99)
POTASSIUM: 3.9 mmol/L (ref 3.5–5.1)
Sodium: 139 mmol/L (ref 135–145)
Total Bilirubin: 0.7 mg/dL (ref 0.3–1.2)
Total Protein: 6.8 g/dL (ref 6.5–8.1)

## 2016-07-15 LAB — CBC
HEMATOCRIT: 34.1 % — AB (ref 36.0–46.0)
Hemoglobin: 10.4 g/dL — ABNORMAL LOW (ref 12.0–15.0)
MCH: 21.8 pg — ABNORMAL LOW (ref 26.0–34.0)
MCHC: 30.5 g/dL (ref 30.0–36.0)
MCV: 71.3 fL — AB (ref 78.0–100.0)
Platelets: 394 10*3/uL (ref 150–400)
RBC: 4.78 MIL/uL (ref 3.87–5.11)
RDW: 17.8 % — AB (ref 11.5–15.5)
WBC: 11.2 10*3/uL — AB (ref 4.0–10.5)

## 2016-07-15 LAB — IRON AND TIBC
IRON: 20 ug/dL — AB (ref 28–170)
Saturation Ratios: 5 % — ABNORMAL LOW (ref 10.4–31.8)
TIBC: 377 ug/dL (ref 250–450)
UIBC: 357 ug/dL

## 2016-07-15 LAB — FERRITIN: FERRITIN: 6 ng/mL — AB (ref 11–307)

## 2016-07-15 LAB — TSH: TSH: 4.843 u[IU]/mL — ABNORMAL HIGH (ref 0.350–4.500)

## 2016-07-15 MED ORDER — BOOST / RESOURCE BREEZE PO LIQD
1.0000 | Freq: Three times a day (TID) | ORAL | Status: DC
  Administered 2016-07-15 – 2016-07-16 (×2): 1 via ORAL

## 2016-07-15 NOTE — Progress Notes (Signed)
PROGRESS NOTE                                                                                                                                                                                                             Patient Demographics:    Miranda BreedingMadison Hawkins, is a 20 y.o. female, DOB - 10/05/1996, WJX:[id number]  Admit date - 07/14/2016   Admitting Physician Catarina Hartshornavid Tat, MD  Outpatient Primary MD for the patient is Terressa KoyanagiKIM, HANNAH R., DO  LOS - 0   Chief Complaint  Patient presents with  . Abdominal Pain  . Emesis       Brief Narrative   20 y.o. female with medical history of elevated blood pressure without diagnosis of hypertension, GERD, gastroparesis presents with 3 month history of nausea and vomiting that has worsened over the past 2 weeks, known history of gastroparesis, improving on Reglan, started on clear liquid will advance as tolerated.  Subjective:    Miranda BreedingMadison Hawkins today has, No headache, No chest pain, No abdominal pain - Reports some nausea, but no vomiting since admission, reports no appetite.  Assessment  & Plan :    Active Problems:   Intractable vomiting   Cyclic vomiting syndrome   Marijuana use, continuous   Transaminasemia  Intractable nausea and vomiting/cyclic vomiting syndrome -Likely due to exacerbation of the patient's underlying gastroparesis in the setting of daily marijuana use - Appears to be improving, still nausea, but no further medical vomiting, will advance to clear liquid diet, and advanced as tolerated. - Continue with IV fluids, - continue with IV  reglan and pepcid, and IV when necessary Zofran -07/14/2016 CT abdomen and pelvis status post cholecystectomy, normal small bowel and colon, normal appendix -05/09/2012 gastric emptying study--severe delayed gastric emptying -04/28/2012 MRCP--normal -02/19/2011 EGD--nodular gastritis -UDS positive for THC -there is some anxiety and psychiatric overlay contributing to  her symptoms  Transaminasemia -Follow on hep B surface antigen, hep c antibody, HIV -s/p cholecystectomy - Trending down  Lung Nodules -incidental finding -will need outpt follow up  Marijuana use -cessation discussed     Code Status : Full  Family Communication  : none at bedside  Disposition Plan  : home  Consults  :  none  Procedures  : none  DVT Prophylaxis  :  Lovenox   Lab Results  Component Value Date  PLT 394 07/15/2016    Antibiotics  :   Anti-infectives    None        Objective:   Vitals:   07/14/16 1815 07/14/16 1839 07/14/16 2156 07/15/16 0525  BP:  146/78 (!) 129/97 (!) 143/61  Pulse: (!) 49 (!) 45 (!) 53 (!) 51  Resp:  Temp:  97.4 F (36.3 C) 98.2 F (36.8 C) 98.6 F (37 C)  TempSrc:  Oral Oral   SpO2: 100% 100% 100% 98%  Weight:      Height:        Wt Readings from Last 3 Encounters:  07/14/16 130.2 kg (287 lb)  07/14/16 130.6 kg (287 lb 14.4 oz)  07/10/16 133.4 kg (294 lb 3.2 oz)     Intake/Output Summary (Last 24 hours) at 07/15/16 1200 Last data filed at 07/15/16 1610  Gross per 24 hour  Intake          1256.25 ml  Output              500 ml  Net           756.25 ml     Physical Exam  Awake Alert, Oriented X 3, No new F.N deficits, Normal affect Park.AT,PERRAL Supple Neck,No JVD, No cervical lymphadenopathy appriciated.  Symmetrical Chest wall movement, Good air movement bilaterally, CTAB RRR,No Gallops,Rubs or new Murmurs, No Parasternal Heave +ve B.Sounds, Abd Soft, No tenderness,  No rebound - guarding or rigidity. No Cyanosis, Clubbing or edema, No new Rash or bruise      Data Review:    CBC  Recent Labs Lab 07/10/16 1511 07/11/16 2031 07/14/16 1500 07/15/16 0501  WBC 10.0 17.8* 12.9* 11.2*  HGB 11.0* 12.0 11.3* 10.4*  HCT 34.0* 37.2 40.0 34.1*  PLT 475.0* 554* PLATELETS APPEAR ADEQUATE 394  MCV 69.2* 69.8* 71.6* 71.3*  MCH  --  22.5* 20.2* 21.8*  MCHC 32.5 32.3 28.3* 30.5  RDW  18.8* 17.8* 18.0* 17.8*  LYMPHSABS  --  2.3 2.6  --   MONOABS  --  0.9 0.6  --   EOSABS  --  0.0 0.1  --   BASOSABS  --  0.0 0.0  --     Chemistries   Recent Labs Lab 07/11/16 2031 07/14/16 1500 07/15/16 0501  NA 138 138 139  K 3.2* 4.2 3.9  CL 106 104 106  CO2 20* 22 22  GLUCOSE 107* 83 76  BUN <5* <5* <5*  CREATININE 0.61 0.65 0.66  CALCIUM 9.7 9.9 9.2  AST 77* 59* 41  ALT 103* 72* 55*  ALKPHOS 117 103 83  BILITOT 0.6 0.7 0.7   ------------------------------------------------------------------------------------------------------------------ No results for input(s): CHOL, HDL, LDLCALC, TRIG, CHOLHDL, LDLDIRECT in the last 72 hours.  Lab Results  Component Value Date   HGBA1C 6.2 12/27/2013   ------------------------------------------------------------------------------------------------------------------  Recent Labs  07/15/16 0501  TSH 4.843*   ------------------------------------------------------------------------------------------------------------------  Recent Labs  07/15/16 0501  FERRITIN 6*  TIBC 377  IRON 20*    Coagulation profile No results for input(s): INR, PROTIME in the last 168 hours.  No results for input(s): DDIMER in the last 72 hours.  Cardiac Enzymes No results for input(s): CKMB, TROPONINI, MYOGLOBIN in the last 168 hours.  Invalid input(s): CK ------------------------------------------------------------------------------------------------------------------ No results found for: BNP  Inpatient Medications  Scheduled Meds: . enoxaparin (LOVENOX) injection  40 mg Subcutaneous Q24H  . famotidine (PEPCID) IV  20 mg Intravenous Q12H  . metoCLOPramide (REGLAN) injection  5  mg Intravenous Q6H   Continuous Infusions: . sodium chloride 1,000 mL (07/15/16 0338)  . sodium chloride 0.9 % 1,000 mL infusion     PRN Meds:.acetaminophen **OR** acetaminophen, ondansetron **OR** ondansetron (ZOFRAN) IV, zolpidem  Micro Results Recent  Results (from the past 240 hour(s))  Culture, Urine     Status: None   Collection Time: 07/10/16  3:11 PM  Result Value Ref Range Status   Organism ID, Bacteria Three or more organisms present,each greater than  Final   Organism ID, Bacteria 10,000 CFU/mL.These organisms,commonly found on  Final   Organism ID, Bacteria external and internal genitalia,are considered to  Final   Organism ID, Bacteria be colonizers.No further testing performed.  Final    Radiology Reports Ct Abdomen Pelvis W Contrast  Result Date: 07/14/2016 CLINICAL DATA:  20 YOF. States she has LUQ pain, with nausea and vomiting, intermittently over past 3 months. Symptoms have worsened the past 2 weeks. History of cholecystectomy. Neg BHCG. Isovue 300 170ml^100mL ISOVUE-300 IOPAMIDOL (ISOVUE-300) INJECTION 61% EXAM: CT ABDOMEN AND PELVIS WITH CONTRAST TECHNIQUE: Multidetector CT imaging of the abdomen and pelvis was performed using the standard protocol following bolus administration of intravenous contrast. CONTRAST:  ISOVUE-300 IOPAMIDOL (ISOVUE-300) INJECTION 61% COMPARISON:  04/29/2012 MRCP FINDINGS: Lower chest: There are discrete ground-glass nodules in the lung bases bilaterally. These are identified in the right middle lobe and both lower lobes. Largest measures 1.7 cm in the right lower lobe on image 7 of series 4. There are no pleural effusions or discrete consolidations. Heart size is normal. No imaged pericardial effusion or significant coronary artery calcifications. Hepatobiliary: The liver is normal in appearance. Status post cholecystectomy. Pancreas: Pancreas is normal in appearance. Spleen: Homogeneous and normal in appearance. In the left anterior central abdomen, numerous solid enhancing masses are identified, consistent with accessory spleens. The largest of these is 3.7 x 4.1 cm compared to 3.2 x 4.1 cm on prior MRI from 2013. Renal/Adrenal: Adrenal glands are normal in appearance. There is normal enhancement  of both kidneys. No hydronephrosis or renal mass. Gastrointestinal tract: The stomach and small bowel loops appear normal. The appendix is well seen and has a normal appearance. The colonic loops are normal in appearance. Reproductive/Pelvis: Uterus is present. No adnexal mass. No free pelvic fluid. Vascular/Lymphatic: No evidence for aortic aneurysm. No retroperitoneal or mesenteric adenopathy. Musculoskeletal/Abdominal wall: Artifact from left nipple piercing. Visualized osseous structures have a normal appearance. Other: none IMPRESSION: 1. Sub solid nodules in the lung bases bilaterally. Considerations include infectious (viral or fungal) or inflammatory process. Drug toxicity, hypersensitivity pneumonitis, and rarely, metastases can have this appearance. 2. Initial follow-up with chest CT at 6-12 months is recommended to confirm persistence. If persistent, repeat CT is recommended every 2 years until 5 years of stability has been established. This recommendation follows the consensus statement: Guidelines for Management of Incidental Pulmonary Nodules Detected on CT Images:From the Fleischner Society 2017; published online before print (10.1148/radiol.4098119147). 3. Status post cholecystectomy. 4. Numerous accessory spleens, stable compared with prior MRI exam. 5. Normal appendix. Electronically Signed   By: Norva Pavlov M.D.   On: 07/14/2016 16:45    Zsazsa Bahena M.D on 07/15/2016 at 12:00 PM  Between 7am to 7pm - Pager - [phone]  After 7pm go to [email or web address] - password [email or web address]  [facility] -  Office  [phone]

## 2016-07-15 NOTE — Progress Notes (Signed)
Initial Nutrition Assessment  DOCUMENTATION CODES:   Severe malnutrition in context of acute illness/injury, Morbid obesity  INTERVENTION:  Provide Boost Breeze po TID, each supplement provides 250 kcal and 9 grams of protein.  Encourage adequate PO intake.   NUTRITION DIAGNOSIS:   Malnutrition related to acute illness as evidenced by percent weight loss, energy intake < or equal to 50% for > or equal to 5 days.  GOAL:   Patient will meet greater than or equal to 90% of their needs  MONITOR:   PO intake, Supplement acceptance, Diet advancement, Labs, Weight trends, Skin, I & O's  REASON FOR ASSESSMENT:   Malnutrition Screening Tool    ASSESSMENT:   20 y.o. female with medical history of elevated blood pressure without diagnosis of hypertension, GERD, gastroparesis presents with 3 month history of nausea and vomiting that has worsened over the past 2 weeks.  Diet has been advanced to a clear liquid diet. Pt reports having a lack of appetite, however is ready consume some clear liquids other than water. Pt reports for the 4-5 days PTA she has not been able to keep any po down and has only been drinking water. Pt reports weight loss of 5-6 lbs over the past week. Per Epic weight records, pt with a 5% weight loss in 10 days. Pt is agreeable to Boost Breeze to aid in caloric and protein needs. RD to order.   Pt with no observed significant fat or muscle mass loss.   Labs and medications reviewed.   Diet Order:  Diet clear liquid Room service appropriate? Yes; Fluid consistency: Thin  Skin:  Reviewed, no issues  Last BM:  8/9  Height:   Ht Readings from Last 1 Encounters:  07/14/16 5\' 6"  (1.676 m)    Weight:   Wt Readings from Last 1 Encounters:  07/14/16 287 lb (130.2 kg)    Ideal Body Weight:  59 kg  BMI:  Body mass index is 46.32 kg/m.  Estimated Nutritional Needs:   Kcal:  2000-2200  Protein:  100-115 grams  Fluid:  2-2.2 L/day  EDUCATION NEEDS:    No education needs identified at this time  Roslyn SmilingStephanie Marilyn Wing, MS, RD, LDN Pager # [phone] After hours/ weekend pager # [phone]

## 2016-07-16 DIAGNOSIS — R030 Elevated blood-pressure reading, without diagnosis of hypertension: Secondary | ICD-10-CM | POA: Diagnosis not present

## 2016-07-16 DIAGNOSIS — F1290 Cannabis use, unspecified, uncomplicated: Secondary | ICD-10-CM

## 2016-07-16 DIAGNOSIS — K219 Gastro-esophageal reflux disease without esophagitis: Secondary | ICD-10-CM | POA: Diagnosis not present

## 2016-07-16 DIAGNOSIS — R740 Nonspecific elevation of levels of transaminase and lactic acid dehydrogenase [LDH]: Secondary | ICD-10-CM | POA: Diagnosis not present

## 2016-07-16 DIAGNOSIS — G43A1 Cyclical vomiting, intractable: Secondary | ICD-10-CM | POA: Diagnosis not present

## 2016-07-16 DIAGNOSIS — K30 Functional dyspepsia: Secondary | ICD-10-CM | POA: Diagnosis not present

## 2016-07-16 DIAGNOSIS — Z6841 Body Mass Index (BMI) 40.0 and over, adult: Secondary | ICD-10-CM | POA: Diagnosis not present

## 2016-07-16 DIAGNOSIS — E860 Dehydration: Secondary | ICD-10-CM | POA: Diagnosis not present

## 2016-07-16 DIAGNOSIS — K3184 Gastroparesis: Secondary | ICD-10-CM | POA: Diagnosis not present

## 2016-07-16 LAB — HIV ANTIBODY (ROUTINE TESTING W REFLEX): HIV SCREEN 4TH GENERATION: NONREACTIVE

## 2016-07-16 LAB — HEPATITIS C ANTIBODY

## 2016-07-16 LAB — HEPATITIS B SURFACE ANTIGEN: HEP B S AG: NEGATIVE

## 2016-07-16 MED ORDER — METOCLOPRAMIDE HCL 10 MG PO TABS: 5.0000 mg | ORAL_TABLET | Freq: Four times a day (QID) | ORAL | 0 refills | Status: DC

## 2016-07-16 MED ORDER — BOOST / RESOURCE BREEZE PO LIQD: 1.0000 | Freq: Three times a day (TID) | ORAL | 0 refills | Status: DC

## 2016-07-16 NOTE — Discharge Summary (Signed)
Discharge Summary  Miranda DawesMadison E Hawkins ZOX:[id number] DOB: 02/16/1996  PCP: Miranda Hawkins, Miranda R., Miranda Hawkins  Admit date: 07/14/2016 Discharge date: 07/16/2016  Time spent: <6830mins  Recommendations for Outpatient Follow-up:  1. F/u with PMD within a week  for hospital discharge follow up, repeat cbc/bmp at follow up,  2. Work letter provided  Discharge Diagnoses:  Active Hospital Problems   Diagnosis Date Noted  . Intractable vomiting 07/14/2016  . Cyclic vomiting syndrome 07/14/2016  . Marijuana use, continuous 07/14/2016  . Transaminasemia 07/14/2016    Resolved Hospital Problems   Diagnosis Date Noted Date Resolved  No resolved problems to display.    Discharge Condition: stable  Diet recommendation: small meals and multiple meals a day  Filed Weights   07/14/16 1502  Weight: 130.2 kg (287 lb)    History of present illness:  Patient coming from: Home  Chief Complaint: N/V  HPI:  Miranda Hawkins is a 20 y.o. female with medical history of elevated blood pressure without diagnosis of hypertension, GERD, gastroparesis presents with 3 month history of nausea and vomiting that has worsened over the past 2 weeks. The patient states that she has been vomiting up to 3 times per day for the past 2 weeks, although she has not had any emesis on 07/14/2016, the day of admission. The patient has had 2 ED visits in the past week, but during her last ED visit, the patient eloped before diagnostic radiographic studies can be done.  She denies any hematemesis, diarrhea, hematochezia, melena. There are no fevers,chest discomfort, shortness of breath, dysuria, hematuria. She denies any recent sick contacts, and she has not seen any raw or undercooked foods. There has not been any travels. The patient has been taking over the counter "Nite Quil" to help her sleep.  The patient has been smoking marijuana on a daily basis for the past 1-2 months hoping that it would stimulate her appetite. She denies any illegal  drugs. She denies any alcohol or tobacco. She denies any NSAIDs. The patient went to visit her PCP on the day of admission, and her PCP contacted the emergency department for direct admission.  In the emergency department, the patient had WBC 12.9 with hemoglobin 11.3. BMP was unremarkable. AST 59, ALT 72, PHOSPHATASE 103, TOTAL BILIRUBIN 2.7, LIPASE 23. CT OF THE ABDOMEN AND PELVIS WAS UNREMARKABLE FOR ANY ACUTE FINDINGS.  Hospital Course:  Active Problems:   Intractable vomiting   Cyclic vomiting syndrome   Marijuana use, continuous   Transaminasemia   Intractable nausea and vomiting/cyclic vomiting syndrome/ with significant dehydration -Likely due to exacerbation of the patient's underlying gastroparesis in the setting of daily marijuana use - Continue with IV fluids, - continue with IV  reglan and pepcid, and IV when necessary Zofran -07/14/2016 CT abdomen and pelvis status post cholecystectomy, normal small bowel and colon, normal appendix -05/09/2012 gastric emptying study--severe delayed gastric emptying -04/28/2012 MRCP--normal -02/19/2011 EGD--nodular gastritis -UDS positive for THC -there is some anxiety and psychiatric overlay contributing to her symptoms,  -improving, discharge home with reglan and diet modification, avoid THC.  Transaminasemia -negative hiv and hepatitis panel -s/p cholecystectomy - Trending down, pmd to repeat lft at hospital follow up  Leukocytosis: resolving, likely from dehydration, ua concentrated, no infection.  Iron deficiency anemia: hgb 10.4, pmd to follow up and start iron supplement once nausea and vomiting resolves.  Lung Nodules -incidental finding, no chest pain, no cough, no sob -PMD to repeat ct chest in 6months  Marijuana use -cessation discussed  Morbid obesity: Body mass index is 46.32 kg/m. life style changes     Code Status : Full  Family Communication  : none at bedside  Disposition Plan  :  home  Consults  :  none  Procedures  : none  DVT Prophylaxis while in the hospital  :  Lovenox    Discharge Exam: BP (!) 145/75 (BP Location: Right Arm)   Pulse (!) 44   Temp 99.2 F (37.3 C) (Oral)   Resp 18   Ht 5\' 6"  (1.676 m)   Wt 130.2 kg (287 lb)   LMP 06/29/2016 (Approximate)   SpO2 100%   BMI 46.32 kg/m   General: obese, NAD Cardiovascular: RRR Respiratory: CTABL Ab: soft, NT/ND, positive bs  Discharge Instructions You were cared for by a hospitalist during your hospital stay. If you have any questions about your discharge medications or the care you received while you were in the hospital after you are discharged, you can call the unit and asked to speak with the hospitalist on call if the hospitalist that took care of you is not available. Once you are discharged, your primary care physician will handle any further medical issues. Please note that NO REFILLS for any discharge medications will be authorized once you are discharged, as it is imperative that you return to your primary care physician (or establish a relationship with a primary care physician if you Miranda Hawkins not have one) for your aftercare needs so that they can reassess your need for medications and monitor your lab values.  Discharge Instructions    Diet general    Complete by:  As directed   Small meals and multiple times a day, advance diet as tolerated   Increase activity slowly    Complete by:  As directed       Medication List    TAKE these medications   feeding supplement Liqd Take 1 Container by mouth 3 (three) times daily between meals.   metoCLOPramide 10 MG tablet Commonly known as:  REGLAN Take 0.5 tablets (5 mg total) by mouth every 6 (six) hours.   norgestimate-ethinyl estradiol 0.25-35 MG-MCG tablet Commonly known as:  ORTHO-CYCLEN,SPRINTEC,PREVIFEM Take 1 tablet by mouth daily.   omeprazole 20 MG capsule Commonly known as:  PRILOSEC Take 1 capsule (20 mg total) by mouth  daily.   ondansetron 4 MG disintegrating tablet Commonly known as:  ZOFRAN ODT Take 1 tablet (4 mg total) by mouth every 8 (eight) hours as needed for nausea or vomiting.   potassium chloride 10 MEQ tablet Commonly known as:  K-DUR Take 1 tablet (10 mEq total) by mouth 2 (two) times daily.      No Known Allergies Follow-up Information    Miranda Basque R., Miranda Hawkins .   Specialty:  Family Medicine Why:  hospital discharge follow up, repeat cbc/bmp/liver function at follow up, pmd to follow up on iron deficiency, start on iron supplement when nausea and vomiting resolved. pmd to repeat CT chest in 6 months to follow up on lung nodules Contact information: [address] Christena Flake [facility] [place] [address] [phone]            The results of significant diagnostics from this hospitalization (including imaging, microbiology, ancillary and laboratory) are listed below for reference.    Significant Diagnostic Studies: Ct Abdomen Pelvis W Contrast  Result Date: 07/14/2016 CLINICAL DATA:  20 YOF. States she has LUQ pain, with nausea and vomiting, intermittently over past 3 months. Symptoms have worsened the past 2  weeks. History of cholecystectomy. Neg BHCG. Isovue 300 144ml^100mL ISOVUE-300 IOPAMIDOL (ISOVUE-300) INJECTION 61% EXAM: CT ABDOMEN AND PELVIS WITH CONTRAST TECHNIQUE: Multidetector CT imaging of the abdomen and pelvis was performed using the standard protocol following bolus administration of intravenous contrast. CONTRAST:  ISOVUE-300 IOPAMIDOL (ISOVUE-300) INJECTION 61% COMPARISON:  04/29/2012 MRCP FINDINGS: Lower chest: There are discrete ground-glass nodules in the lung bases bilaterally. These are identified in the right middle lobe and both lower lobes. Largest measures 1.7 cm in the right lower lobe on image 7 of series 4. There are no pleural effusions or discrete consolidations. Heart size is normal. No imaged pericardial effusion or significant coronary artery calcifications.  Hepatobiliary: The liver is normal in appearance. Status post cholecystectomy. Pancreas: Pancreas is normal in appearance. Spleen: Homogeneous and normal in appearance. In the left anterior central abdomen, numerous solid enhancing masses are identified, consistent with accessory spleens. The largest of these is 3.7 x 4.1 cm compared to 3.2 x 4.1 cm on prior MRI from 2013. Renal/Adrenal: Adrenal glands are normal in appearance. There is normal enhancement of both kidneys. No hydronephrosis or renal mass. Gastrointestinal tract: The stomach and small bowel loops appear normal. The appendix is well seen and has a normal appearance. The colonic loops are normal in appearance. Reproductive/Pelvis: Uterus is present. No adnexal mass. No free pelvic fluid. Vascular/Lymphatic: No evidence for aortic aneurysm. No retroperitoneal or mesenteric adenopathy. Musculoskeletal/Abdominal wall: Artifact from left nipple piercing. Visualized osseous structures have a normal appearance. Other: none IMPRESSION: 1. Sub solid nodules in the lung bases bilaterally. Considerations include infectious (viral or fungal) or inflammatory process. Drug toxicity, hypersensitivity pneumonitis, and rarely, metastases can have this appearance. 2. Initial follow-up with chest CT at 6-12 months is recommended to confirm persistence. If persistent, repeat CT is recommended every 2 years until 5 years of stability has been established. This recommendation follows the consensus statement: Guidelines for Management of Incidental Pulmonary Nodules Detected on CT Images:From the Fleischner Society 2017; published online before print (10.1148/radiol.1610960454). 3. Status post cholecystectomy. 4. Numerous accessory spleens, stable compared with prior MRI exam. 5. Normal appendix. Electronically Signed   By: Norva Pavlov M.D.   On: 07/14/2016 16:45    Microbiology: Recent Results (from the past 240 hour(s))  Culture, Urine     Status: None    Collection Time: 07/10/16  3:11 PM  Result Value Ref Range Status   Organism ID, Bacteria Three or more organisms present,each greater than  Final   Organism ID, Bacteria 10,000 CFU/mL.These organisms,commonly found on  Final   Organism ID, Bacteria external and internal genitalia,are considered to  Final   Organism ID, Bacteria be colonizers.No further testing performed.  Final     Labs: Basic Metabolic Panel:  Recent Labs Lab 07/11/16 2031 07/14/16 1500 07/15/16 0501  NA 138 138 139  K 3.2* 4.2 3.9  CL 106 104 106  CO2 20* 22 22  GLUCOSE 107* 83 76  BUN <5* <5* <5*  CREATININE 0.61 0.65 0.66  CALCIUM 9.7 9.9 9.2   Liver Function Tests:  Recent Labs Lab 07/11/16 2031 07/14/16 1500 07/15/16 0501  AST 77* 59* 41  ALT 103* 72* 55*  ALKPHOS 117 103 83  BILITOT 0.6 0.7 0.7  PROT 8.8* 8.3* 6.8  ALBUMIN 4.8 4.4 3.4*    Recent Labs Lab 07/11/16 2031 07/14/16 1500  LIPASE 25 23   No results for input(s): AMMONIA in the last 168 hours. CBC:  Recent Labs Lab 07/10/16 1511 07/11/16 2031  07/14/16 1500 07/15/16 0501  WBC 10.0 17.8* 12.9* 11.2*  NEUTROABS  --  14.6* 9.6*  --   HGB 11.0* 12.0 11.3* 10.4*  HCT 34.0* 37.2 40.0 34.1*  MCV 69.2* 69.8* 71.6* 71.3*  PLT 475.0* 554* PLATELETS APPEAR ADEQUATE 394   Cardiac Enzymes: No results for input(s): CKTOTAL, CKMB, CKMBINDEX, TROPONINI in the last 168 hours. BNP: BNP (last 3 results) No results for input(s): BNP in the last 8760 hours.  ProBNP (last 3 results) No results for input(s): PROBNP in the last 8760 hours.  CBG: No results for input(s): GLUCAP in the last 168 hours.     SignedAlbertine Grates MD, PhD  [facility] 07/16/2016, 12:01 PM

## 2016-07-17 LAB — CERVICOVAGINAL ANCILLARY ONLY: Candida vaginitis: NEGATIVE

## 2016-07-22 ENCOUNTER — Telehealth: Payer: Self-pay | Admitting: Family Medicine

## 2016-07-22 NOTE — Telephone Encounter (Signed)
Pt is aware Miranda Hawkins not in office today and will be back tomorrow. Pt is requesting blood work results and pap

## 2016-07-22 NOTE — Telephone Encounter (Signed)
Tried calling pt back with NA. 

## 2016-07-23 MED ORDER — METRONIDAZOLE 0.75 % VA GEL: 1.0000 | Freq: Two times a day (BID) | VAGINAL | 0 refills | Status: DC

## 2016-07-23 MED FILL — metroNIDAZOLE 0.75 % GEL: 0.75 | 5 days supply | Qty: 70 | Fill #0

## 2016-07-23 NOTE — Addendum Note (Signed)
Addended by: Johnella MoloneyFUNDERBURK, Marda Breidenbach A on: 07/23/2016 03:13 PM   Modules accepted: Orders

## 2016-07-23 NOTE — Telephone Encounter (Signed)
See results note. 

## 2016-07-27 ENCOUNTER — Ambulatory Visit: Payer: 59 | Admitting: Family Medicine

## 2016-07-27 DIAGNOSIS — Z0289 Encounter for other administrative examinations: Secondary | ICD-10-CM

## 2016-08-03 ENCOUNTER — Ambulatory Visit (INDEPENDENT_AMBULATORY_CARE_PROVIDER_SITE_OTHER): Payer: 59 | Admitting: Family Medicine

## 2016-08-03 ENCOUNTER — Encounter: Payer: Self-pay | Admitting: Family Medicine

## 2016-08-03 VITALS — BP 102/76 | HR 83 | Temp 97.8°F | Ht 66.0 in | Wt 283.6 lb

## 2016-08-03 DIAGNOSIS — R918 Other nonspecific abnormal finding of lung field: Secondary | ICD-10-CM

## 2016-08-03 DIAGNOSIS — Z6841 Body Mass Index (BMI) 40.0 and over, adult: Secondary | ICD-10-CM

## 2016-08-03 DIAGNOSIS — D649 Anemia, unspecified: Secondary | ICD-10-CM

## 2016-08-03 DIAGNOSIS — R7989 Other specified abnormal findings of blood chemistry: Secondary | ICD-10-CM | POA: Diagnosis not present

## 2016-08-03 DIAGNOSIS — R946 Abnormal results of thyroid function studies: Secondary | ICD-10-CM

## 2016-08-03 DIAGNOSIS — R945 Abnormal results of liver function studies: Secondary | ICD-10-CM

## 2016-08-03 DIAGNOSIS — F419 Anxiety disorder, unspecified: Secondary | ICD-10-CM

## 2016-08-03 LAB — HEPATIC FUNCTION PANEL
ALK PHOS: 95 U/L (ref 39–117)
ALT: 8 U/L (ref 0–35)
AST: 11 U/L (ref 0–37)
Albumin: 4.3 g/dL (ref 3.5–5.2)
BILIRUBIN DIRECT: 0.1 mg/dL (ref 0.0–0.3)
TOTAL PROTEIN: 7.4 g/dL (ref 6.0–8.3)
Total Bilirubin: 0.3 mg/dL (ref 0.2–1.2)

## 2016-08-03 LAB — CBC
HCT: 37.1 % (ref 36.0–46.0)
Hemoglobin: 11.9 g/dL — ABNORMAL LOW (ref 12.0–15.0)
MCHC: 32 g/dL (ref 30.0–36.0)
MCV: 70.1 fl — AB (ref 78.0–100.0)
Platelets: 452 10*3/uL — ABNORMAL HIGH (ref 150.0–400.0)
RBC: 5.29 Mil/uL — ABNORMAL HIGH (ref 3.87–5.11)
RDW: 19 % — AB (ref 11.5–15.5)
WBC: 12.1 10*3/uL — ABNORMAL HIGH (ref 4.0–10.5)

## 2016-08-03 NOTE — Patient Instructions (Addendum)
BEFORE YOU LEAVE: -lab appointment in 1 month to recheck thyroid -physical in 4-6 months -labs  I am so glad that you are feeling better!  See your gynecologist to help with the heavy menstrual bleeding as planned.  See the Gastroenterologist as planned for the Nausea, Vomiting, Anemia and any abnormal liver labs. Any work restrictions for your nausea and vomiting and any further paperwork regarding this should be completed by your gastroenterologist.  You will need to repeat a CT scan for the lung nodules in 6-12 months. I placed an order for this. Please ensure you keep track of this and repeat this exam as we advised.  See a counselor and/or psychiatrist to help with anxiety. See the numbers provided. Please call to schedule an appointment.  We have ordered labs or studies at this visit. It can take up to 1-2 weeks for results and processing. IF results require follow up or explanation, we will call you with instructions. Clinically stable results will be released to your [facility]. If you have not heard from us or cannot find your results in [facility] in 2 weeks please contact our office at [phone].  If you are not yet signed up for [facility], please consider signing up.   We recommend the following healthy lifestyle: 1) Small portions - eat off of salad plate instead of dinner plate 2) Eat a healthy clean diet with avoidance of (less then 1 serving per week) processed foods, sweetened drinks, white starches, red meat, fast foods and sweets and consisting of: * 5-9 servings per day of fresh or frozen fruits and vegetables (not corn or potatoes, not dried or canned) *nuts and seeds, beans *olives and olive oil *small portions of lean meats such as fish and white chicken  *small portions of whole grains 3)Get at least 150 minutes of sweaty aerobic exercise per week 4)reduce stress - counseling, meditation, relaxation to balance other aspects of your life

## 2016-08-03 NOTE — Progress Notes (Signed)
Pre visit review using our clinic review tool, if applicable. No additional management support is needed unless otherwise documented below in the visit note. 

## 2016-08-03 NOTE — Progress Notes (Addendum)
HPI:  Miranda Hawkins is a 20 yo F with poor compliance with follow up, recently hospitalized for presumed gastroparesis per discharge notes, with failure of outpt treatment, here for hospital follow up. She saw in the past, and will see GI for management of her gastroparesis (has appt in a few days). She apparently uses THC on a regular basis. CT scan was negative in the hospital. She had extensive evaluation for similar symptoms in 2013, then stopped going to see her specialist and stopped taking her medications and symptoms recurred this year. Per discharge summary anxiety was also possibly contributing to her symptoms. There were several issues for primary care follow up according to the discharge summary: -repeat LFTs for transaminitis -iron def anemia - repeat CBC -lung nodules - incidental finding, per radiology notes repeat CT in 6-12 months advised Reports: feels much better, only has vomited 2x since discharge - taking reglan and zofran. No tobacco use. Did used to smoke weed - now reports has stopped. Some stress and anxiety, but she does not feel is bad. Denies depression, panic attacks, hallucinations. Reports is feeling much better now that nausea is under control.  Has hx anemia, heavy menstrual bleeding. Is seeing Dr. Henderson CloudHorvath for this. Reports has appt later this week. Is considering mirena.  ROS: See pertinent positives and negatives per HPI.  Past Medical History:  Diagnosis Date  . Abdominal pain   . Fatigue   . Gastroparesis   . GERD (gastroesophageal reflux disease)   . Headache(784.0)   . Helicobacter pylori (H. pylori)   . Hoarseness   . Hypertension    high blood pressure readings  . Nausea vomiting and diarrhea   . Night sweats   . Obesity   . Rectal bleeding   . Ulcer   . Wears glasses     Past Surgical History:  Procedure Laterality Date  . ESOPHAGOGASTRODUODENOSCOPY  02/2011  . LAPAROSCOPIC CHOLECYSTECTOMY  05/2011  . MOUTH SURGERY  5 years ago  .  TONSILLECTOMY AND ADENOIDECTOMY  2002    Family History  Problem Relation Age of Onset  . Hypertension Mother   . Hyperlipidemia Mother   . Alcoholism Father   . Arthritis Father     paternal grandparents  . Diabetes Maternal Grandmother   . Arthritis Maternal Grandmother   . Heart disease Maternal Grandmother   . Diabetes Maternal Aunt   . Alcoholism      maternal grandparents  . Hyperlipidemia      all four grandparents  . Stroke    . Mental illness      father's side  . Heart disease Paternal Grandfather   . Colon cancer Neg Hx     Social History   Social History  . Marital status: Single    Spouse name: N/A  . Number of children: 0  . Years of education: N/A   Occupational History  . Student     Social History Main Topics  . Smoking status: Never Smoker  . Smokeless tobacco: Never Used  . Alcohol use No  . Drug use:     Types: Marijuana     Comment: daily  . Sexual activity: Not Asked   Other Topics Concern  . 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  metoCLOPramide (REGLAN) 10 MG tablet, Take 0.5 tablets (5 mg total) by mouth every 6 (six) hours., Disp: 10 tablet, Rfl: 0 .  metroNIDAZOLE (METROGEL VAGINAL) 0.75 % vaginal gel, Place 1 Applicatorful vaginally 2 (two) times daily., Disp: 70 g, Rfl: 0 .  omeprazole (PRILOSEC) 20 MG capsule, Take 1 capsule (20 mg total) by mouth daily., Disp: 31 capsule, Rfl: 0 .  ondansetron (ZOFRAN ODT) 4 MG disintegrating tablet, Take 1 tablet (4 mg total) by mouth every 8 (eight) hours as needed for nausea or vomiting., Disp: 20 tablet, Rfl: 0  EXAM:  Vitals:   08/03/16 1138  BP: 102/76  Pulse: 83  Temp: 97.8 F (36.6 C)    Body mass index is  45.77 kg/m.  GENERAL: vitals reviewed and listed above, alert, oriented, appears well hydrated and in no acute distress  HEENT: atraumatic, conjunttiva clear, no obvious abnormalities on inspection of external nose and ears  NECK: no obvious masses on inspection  LUNGS: clear to auscultation bilaterally, no wheezes, rales or rhonchi, good air movement  CV: HRRR, no peripheral edema  MS: moves all extremities without noticeable abnormality  PSYCH: pleasant and cooperative, no obvious depression or anxiety  ASSESSMENT AND PLAN:  Discussed the following assessment and plan:   Pulmonary nodules - Plan: CT Chest Wo Contrast  Abnormal LFTs - Plan: Hepatic Function Panel, TSH, T4, Free  Anemia, unspecified anemia type - Plan: CBC  Borderline abnormal TFTs  Anxiety  BMI 45.0-49.9, adult (HCC)  -am so glad she is feeling better -repeat labs and advised keep GI appt for gastroparesis, anemia, any abnormal LFTs - advised any further work restrictions/recommendations/paperwork come from specialist -advised follow up with gyn as well as planned regarding the menorrhagia as this is likely the cause of the anemia -advised of pulm nodules - repeat CT in 6-12 months ordered, advised of importance of repeat and that it is her responsibility to ensure does repeat exam -advise psychiatry for anxiety and advised against THC -advise repeat thyroid testing in 1 month once feeling better - lab orders placed -healthy lifestyle advised -HM due reviewed: does pap with gyn, vaccines offered -follow up in 4-6 months -Patient advised to return or notify a doctor immediately if symptoms worsen or persist or new concerns arise.  Patient Instructions  BEFORE YOU LEAVE: -lab appointment in 1 month to recheck thyroid -physical in 4-6 months -labs  I am so glad that you are feeling better!  See your gynecologist to help with the heavy menstrual bleeding as planned.  See the Gastroenterologist as  planned for the Nausea, Vomiting, Anemia and any abnormal liver labs. Any work restrictions for your nausea and vomiting and any further paperwork regarding this should be completed by your gastroenterologist.  You will need to repeat a CT scan for the lung nodules in 6-12 months. I placed an order for this. Please ensure you keep track of this and repeat this exam as we advised.  See a counselor and/or psychiatrist to help with anxiety. See the numbers provided. Please call to schedule an appointment.  We have ordered labs or studies at this visit. It can take up to 1-2 weeks for results and processing. IF results require follow up or explanation, we will call you with instructions. Clinically stable results will be released to your  MYCHART. If you have not heard from [place] or cannot find your results in [facility] in 2 weeks please contact our office at [phone].  If you are not yet signed up for [facility], please consider signing up.   We recommend the following healthy lifestyle: 1) Small portions - eat off of salad plate instead of dinner plate 2) Eat a healthy clean diet with avoidance of (less then 1 serving per week) processed foods, sweetened drinks, white starches, red meat, fast foods and sweets and consisting of: * 5-9 servings per day of fresh or frozen fruits and vegetables (not corn or potatoes, not dried or canned) *nuts and seeds, beans *olives and olive oil *small portions of lean meats such as fish and white chicken  *small portions of whole grains 3)Get at least 150 minutes of sweaty aerobic exercise per week 4)reduce stress - counseling, meditation, relaxation to balance other aspects of your life             Kriste Basque R., DO

## 2016-08-04 ENCOUNTER — Inpatient Hospital Stay: Admission: RE | Admit: 2016-08-04 | Payer: 59 | Source: Ambulatory Visit

## 2016-08-05 ENCOUNTER — Inpatient Hospital Stay: Admission: RE | Admit: 2016-08-05 | Payer: 59 | Source: Ambulatory Visit

## 2016-08-06 ENCOUNTER — Encounter: Payer: Self-pay | Admitting: Gastroenterology

## 2016-08-06 ENCOUNTER — Ambulatory Visit (INDEPENDENT_AMBULATORY_CARE_PROVIDER_SITE_OTHER): Payer: 59 | Admitting: Gastroenterology

## 2016-08-06 VITALS — BP 102/68 | HR 88 | Ht 66.0 in | Wt 282.0 lb

## 2016-08-06 DIAGNOSIS — R1111 Vomiting without nausea: Secondary | ICD-10-CM | POA: Diagnosis not present

## 2016-08-06 DIAGNOSIS — F1290 Cannabis use, unspecified, uncomplicated: Secondary | ICD-10-CM | POA: Diagnosis not present

## 2016-08-06 DIAGNOSIS — K3184 Gastroparesis: Secondary | ICD-10-CM | POA: Diagnosis not present

## 2016-08-06 DIAGNOSIS — R1013 Epigastric pain: Secondary | ICD-10-CM | POA: Diagnosis not present

## 2016-08-06 DIAGNOSIS — G8929 Other chronic pain: Secondary | ICD-10-CM

## 2016-08-06 MED ORDER — PROCHLORPERAZINE 25 MG RE SUPP: 25.0000 mg | Freq: Two times a day (BID) | RECTAL | 0 refills | Status: DC | PRN

## 2016-08-06 NOTE — Progress Notes (Signed)
[facility] Consult Note:  History: Miranda DawesMadison E Hawkins 08/06/2016  Referring physician: Terressa KoyanagiKIM, HANNAH R., DO  Reason for consult/chief complaint: gastroparesis (had a flare early this month, recent ED visit)   Subjective  HPI:  This young woman was referred back by primary care due to ongoing problems with vomiting and gastroparesis. She is previously known to Dr. Jarold MottoPatterson, who saw her from 2013 01/26/2014. [facility] Chestine Sporelark of [facility] GI who performed an upper endoscopy in 2012. The patient had a gastric emptying study in 2013 showing 100% retention at 2 hours. She apparently did well for a long time on domperidone which she was getting from some compounding pharmacy mother is not clear who prescribed it initially. She was unable to continue it due to its expense. Miranda Hawkins was following a specific diet and taking as needed Reglan and other antiemetics, and says she was under reasonably good control until several months ago. Without clear reason, she started having more episodes of intractable epigastric pain and vomiting bringing her to the ED on multiple occasions. She was often getting sent home from work due to her symptoms. She admits that she started smoking marijuana in early 2015 and stopped this after a recent brief hospital stay earlier this month. She is now taking metoclopramide 10 mg as needed when she has nausea or upper abdominal pain or vomiting. She also has as needed Zofran to take Her bowel habits are regular and she denies rectal bleeding. ROS:  Review of Systems  Constitutional: Negative for appetite change and unexpected weight change.  HENT: Negative for mouth sores and voice change.   Eyes: Negative for pain and redness.  Respiratory: Negative for cough and shortness of breath.   Cardiovascular: Negative for chest pain and palpitations.  Genitourinary: Negative for dysuria and hematuria.  Musculoskeletal: Negative for  arthralgias and myalgias.  Skin: Negative for pallor and rash.  Neurological: Negative for weakness and headaches.  Hematological: Negative for adenopathy.     Past Medical History: Past Medical History:  Diagnosis Date  . Abdominal pain   . Fatigue   . Gastroparesis   . GERD (gastroesophageal reflux disease)   . Headache(784.0)   . Helicobacter pylori (H. pylori)   . Hoarseness   . Hypertension    high blood pressure readings  . Nausea vomiting and diarrhea   . Night sweats   . Obesity   . Rectal bleeding   . Ulcer   . Wears glasses      Past Surgical History: Past Surgical History:  Procedure Laterality Date  . ESOPHAGOGASTRODUODENOSCOPY  02/2011  . LAPAROSCOPIC CHOLECYSTECTOMY  05/2011  . MOUTH SURGERY  5 years ago  . TONSILLECTOMY AND ADENOIDECTOMY  2002     Family History: Family History  Problem Relation Age of Onset  . Hypertension Mother   . Hyperlipidemia Mother   . Alcoholism Father   . Arthritis Father     paternal grandparents  . Diabetes Maternal Grandmother   . Arthritis Maternal Grandmother   . Heart disease Maternal Grandmother   . Diabetes Maternal Aunt   . Alcoholism      maternal grandparents  . Hyperlipidemia      all four grandparents  . Stroke    . Mental illness      father's side  . Heart disease Paternal Grandfather   . Colon cancer Neg Hx     Social History: Social History   Social History  . Marital status:  Single    Spouse name: N/A  . Number of children: 0  . Years of education: N/A   Occupational History  . Student     Social History Main Topics  . Smoking status: Never Smoker  . Smokeless tobacco: Never Used  . Alcohol use No  . Drug use:     Types: Marijuana     Comment: daily  . Sexual activity: Not Asked   Other Topics Concern  . 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 metoCLOPramide (REGLAN) 10 MG tablet Take 0.5 tablets (5 mg total) by mouth every 6 (six) hours. 10 tablet 0  . metroNIDAZOLE (METROGEL VAGINAL) 0.75 % vaginal gel Place 1 Applicatorful vaginally 2 (two) times daily. 70 g 0  . omeprazole (PRILOSEC) 20 MG capsule Take 1 capsule (20 mg total) by mouth daily. 31 capsule 0  . ondansetron (ZOFRAN ODT) 4 MG disintegrating tablet Take 1 tablet (4 mg total) by mouth every 8 (eight) hours as needed for nausea or vomiting. 20 tablet 0  . prochlorperazine (COMPAZINE) 25 MG suppository Place 1 suppository (25 mg total) rectally every 12 (twelve) hours as needed for nausea or vomiting. For intractable vomiting when oral meds cannot be taken 6 suppository 0   No current facility-administered medications for this visit.       ___________________________________________________________________ Objective   Exam:  BP 102/68 (BP Location: Left Arm, Patient Position: Sitting, Cuff Size: Large)   Pulse 88   Ht 5\' 6"  (1.676 m) Comment: height measured without shoes  Wt 282 lb (127.9 kg)   LMP 06/19/2016   BMI 45.52 kg/m    General: this is a(n) Obese young woman who is well-appearing, well hydrated, good muscle mass   Eyes: sclera anicteric, no redness  ENT: oral mucosa moist without lesions, no cervical or supraclavicular lymphadenopathy, good dentition  CV: RRR without murmur, S1/S2, no JVD, no peripheral edema  Resp: clear to auscultation bilaterally, normal RR and effort noted  GI: soft, no tenderness, with active bowel sounds. No guarding or palpable organomegaly noted.  Skin; warm and dry, no rash or jaundice noted  Neuro: awake, alert and oriented x 3. Normal gross motor function and fluent  speech  Labs:  CBC    Component Value Date/Time   WBC 12.1 (H) 08/03/2016 1207   RBC 5.29 (H) 08/03/2016 1207   HGB 11.9 (L) 08/03/2016 1207   HCT 37.1 08/03/2016 1207   PLT 452.0 (H) 08/03/2016 1207   MCV 70.1 (L) 08/03/2016 1207   MCH 21.8 (L) 07/15/2016 0501   MCHC 32.0 08/03/2016 1207   RDW 19.0 (H) 08/03/2016 1207   LYMPHSABS 2.6 07/14/2016 1500   MONOABS 0.6 07/14/2016 1500   EOSABS 0.1 07/14/2016 1500   BASOSABS 0.0 07/14/2016 1500   CMP Latest Ref Rng & Units 08/03/2016 07/15/2016 07/14/2016  Glucose 65 - 99 mg/dL - 76 83  BUN 6 - 20 mg/dL - <1(O) <1(W)  Creatinine 0.44 - 1.00 mg/dL - 9.60 4.54  Sodium  135 - 145 mmol/L - 139 138  Potassium 3.5 - 5.1 mmol/L - 3.9 4.2  Chloride 101 - 111 mmol/L - 106 104  CO2 22 - 32 mmol/L - 22 22  Calcium 8.9 - 10.3 mg/dL - 9.2 9.9  Total Protein 6.0 - 8.3 g/dL 7.4 6.8 4.5(W)  Total Bilirubin 0.2 - 1.2 mg/dL 0.3 0.7 0.7  Alkaline Phos 39 - 117 U/L 95 83 103  AST 0 - 37 U/L 11 41 59(H)  ALT 0 - 35 U/L 8 55(H) 72(H)     Radiologic Studies:  See 2013 GES - 100% retention at 2 hrs. Recent CTAP:  pulm nodules, accessory spleens.  No GI tract inflammation or obstruction EGD 2012 - nml.  Bx neg for eosinophilic condition, also H pylori neg after prior Rx  Assessment: Encounter Diagnoses  Name Primary?  . Gastroparesis Yes  . Intractable vomiting without nausea, vomiting of unspecified type   . Marijuana use, continuous   . Abdominal pain, chronic, epigastric     Medicine appears to have idiopathic gastroparesis. Her recent exacerbation of this could be at least in part due to cannabis hyperemesis, and I have applauded her for stopping marijuana use. She needs updated testing and most likely referral to the [facility] to discuss obtaining compassionate use of domperidone and see whether they are doing any clinical trials regarding gastric pacemaker. Studies to date have been mixed and not terribly  encouraging regarding the long-term benefit of gastric pacemakers, but perhaps this should be revisited. I discussed in detail the long-term risk of metoclopramide due to tardive dyskinesia. She seemed very surprised to learn that, and stated that had not previously been conveyed to her. Nevertheless, at this point it is necessary at least in the short run to control her symptoms and hopefully keep her out of the ED and able to work.  Plan:  Metoclopramide 5 mg once every morning, then a cup to 3 times a day as needed if having severe nausea or vomiting. She will take as needed Zofran as well, and I gave her prescription for Compazine suppositories to take in case of emergency if she has intractable vomiting to the point that she cannot keep down oral meds, Compazine suppository might be able to keep her out of the EGD.  4 hour gastric emptying study has been ordered.  Referral to the [facility] motility clinic after those results.  Total time 35 minutes, over half spent reviewing records counseling and coordinating care.  Thank you for the courtesy of this consult.  Please call me with any questions or concerns.  Charlie Pitter III  CC: Terressa Koyanagi., DO

## 2016-08-06 NOTE — Patient Instructions (Signed)
[facility]-2016 at 7:30AM. Please arrive at least 15 minutes prior to your appointment for registration. Please make certain not to have anything to eat or drink after midnight the night before your test. Hold all stomach medications (Reglan) 5 days prior to your test. If you need to reschedule your appointment, please contact radiology scheduling at [phone]. _____________________________________________________________________ A gastric-emptying study measures how long it takes for food to move through your stomach. There are several ways to measure stomach emptying. In the most common test, you eat food that contains a small amount of radioactive material. A scanner that detects the movement of the radioactive material is placed over your abdomen to monitor the rate at which food leaves your stomach. This test normally takes about 4 hours to complete. _____________________________________________________________________  If you are age 22 or older, your body mass index should be between 23-30. Your Body mass index is 45.52 kg/m. If this is out of the aforementioned range listed, please consider follow up with your Primary Care Provider.  If you are age 22 or younger, your body mass index should be between 19-25. Your Body mass index is 45.52 kg/m. If this is out of the aformentioned range listed, please consider follow up with your Primary Care Provider.   Thank you for choosing [facility]  Dr Amada JupiterHenry Danis III

## 2016-08-07 ENCOUNTER — Inpatient Hospital Stay: Admission: RE | Admit: 2016-08-07 | Payer: 59 | Source: Ambulatory Visit

## 2016-08-18 ENCOUNTER — Encounter (HOSPITAL_COMMUNITY): Payer: 59

## 2016-08-21 ENCOUNTER — Telehealth: Payer: Self-pay | Admitting: Family Medicine

## 2016-08-21 ENCOUNTER — Inpatient Hospital Stay: Admission: RE | Admit: 2016-08-21 | Payer: 59 | Source: Ambulatory Visit

## 2016-08-21 NOTE — Telephone Encounter (Signed)
CT for what? She was due for CT lungs in 6-12 months - can we make sure this is scheduled appropriately? Maybe I placed order incorrectly?Do advise she do CT lungs in 6-12 months to recheck the pulm nodules - very important. Thanks.Thanks!

## 2016-08-21 NOTE — Telephone Encounter (Signed)
She was a no show for her CT scan today and she's not sure that she wants to get it done. She hasn't shown several times.

## 2016-08-24 ENCOUNTER — Telehealth: Payer: Self-pay | Admitting: Gastroenterology

## 2016-08-24 MED ORDER — ONDANSETRON 4 MG PO TBDP: 4.0000 mg | ORAL_TABLET | Freq: Three times a day (TID) | ORAL | 0 refills | Status: DC | PRN

## 2016-08-24 MED FILL — ONDANSETRON ODT 4 MG TABLET: 4 | 7 days supply | Qty: 20 | Fill #0

## 2016-08-24 MED FILL — PROCHLORPERAZINE 25 MG SUPP: 25 | 3 days supply | Qty: 6 | Fill #0

## 2016-08-24 NOTE — Telephone Encounter (Signed)
Patient reports that she never filled rx for compazine and is out of zofran. She is at the pharmacy now.  I hve sent a refill for zofran.  She will pick up both.  She also rescheduled GES ordered at office visit on 08/06/16 to 09/01/16.  She is advised to pick up rx and immediately take zofran ODT and go home and insert suppository.  She is advised that she should call back if N&V not controlled by above.

## 2016-08-24 NOTE — Telephone Encounter (Signed)
I called Misty StanleyStacey and informed her of the message below and she stated she will send a note for a recall to [employer].

## 2016-08-31 ENCOUNTER — Other Ambulatory Visit: Payer: Self-pay

## 2016-08-31 ENCOUNTER — Telehealth: Payer: Self-pay | Admitting: Gastroenterology

## 2016-08-31 ENCOUNTER — Other Ambulatory Visit: Payer: Self-pay | Admitting: Gastroenterology

## 2016-08-31 MED ORDER — PROCHLORPERAZINE 25 MG RE SUPP: RECTAL | 2 refills | Status: DC

## 2016-08-31 MED FILL — PROCHLORPERAZINE 25 MG SUPP: 25 | 6 days supply | Qty: 12 | Fill #0

## 2016-08-31 NOTE — Telephone Encounter (Signed)
Yes, please refill compazine suppository 25 mg Sig; one PR every 12 hours as needed for nausea/vomiting.  Disp #20, RF 2

## 2016-08-31 NOTE — Telephone Encounter (Signed)
GES patient. She is off Reglan. GES is tomorrow. She is careful with her diet. Zofran not helping. Has used all 6 of the Compazine suppositories. They seem to help more. Asking for a refill.

## 2016-08-31 NOTE — Telephone Encounter (Signed)
Pt requesting refills on her Reglan 10 mg TID as needed for Nausea and vomiting. Pt is due to have her gastric emptying study tomorrow (09-01-2016).

## 2016-08-31 NOTE — Telephone Encounter (Signed)
Left patient a message per her request.

## 2016-09-01 ENCOUNTER — Ambulatory Visit (HOSPITAL_COMMUNITY)
Admission: RE | Admit: 2016-09-01 | Discharge: 2016-09-01 | Disposition: A | Discharge status: Home / Self Care | Payer: 59 | Source: Ambulatory Visit | Attending: Gastroenterology | Admitting: Gastroenterology

## 2016-09-01 DIAGNOSIS — R1111 Vomiting without nausea: Secondary | ICD-10-CM | POA: Diagnosis not present

## 2016-09-01 DIAGNOSIS — R1013 Epigastric pain: Secondary | ICD-10-CM | POA: Insufficient documentation

## 2016-09-01 DIAGNOSIS — F1290 Cannabis use, unspecified, uncomplicated: Secondary | ICD-10-CM | POA: Insufficient documentation

## 2016-09-01 DIAGNOSIS — G8929 Other chronic pain: Secondary | ICD-10-CM | POA: Diagnosis not present

## 2016-09-01 DIAGNOSIS — K3184 Gastroparesis: Secondary | ICD-10-CM | POA: Insufficient documentation

## 2016-09-01 DIAGNOSIS — R109 Unspecified abdominal pain: Secondary | ICD-10-CM | POA: Diagnosis not present

## 2016-09-01 MED ORDER — TECHNETIUM TC 99M SULFUR COLLOID
2.2000 | Freq: Once | INTRAVENOUS | Status: AC | PRN
  Administered 2016-09-01: 2.2 via ORAL

## 2016-09-03 ENCOUNTER — Other Ambulatory Visit: Payer: 59

## 2016-09-04 ENCOUNTER — Ambulatory Visit (HOSPITAL_COMMUNITY): Payer: 59

## 2016-09-09 NOTE — Progress Notes (Signed)
Tried twice to reach patient by phone, leaving messages. Letter mailed with results and recommendations on 09/09/16.

## 2016-10-07 ENCOUNTER — Other Ambulatory Visit: Payer: Self-pay | Admitting: Obstetrics and Gynecology

## 2016-10-07 DIAGNOSIS — Z6841 Body Mass Index (BMI) 40.0 and over, adult: Secondary | ICD-10-CM | POA: Diagnosis not present

## 2016-10-07 DIAGNOSIS — Z124 Encounter for screening for malignant neoplasm of cervix: Secondary | ICD-10-CM | POA: Diagnosis not present

## 2016-10-07 DIAGNOSIS — Z01419 Encounter for gynecological examination (general) (routine) without abnormal findings: Secondary | ICD-10-CM | POA: Diagnosis not present

## 2016-10-08 LAB — CYTOLOGY - PAP

## 2016-10-12 ENCOUNTER — Encounter: Payer: 59 | Admitting: Family Medicine

## 2016-10-12 ENCOUNTER — Telehealth: Payer: Self-pay | Admitting: *Deleted

## 2016-10-12 DIAGNOSIS — Z0289 Encounter for other administrative examinations: Secondary | ICD-10-CM

## 2016-10-12 NOTE — Progress Notes (Deleted)
HPI:  Here for CPE:  -Concerns and/or follow up today:  Miranda Hawkins is a 20 year old female here for a physical today. She has a past medical history significant for morbid obesity, some issues with poor compliance, irregular menstrual bleeding (managed by her gynecologist ), gastroparesis (managed by GI ), anxiety (psychiatric care advised ) and an abnormal thyroid test last set of labs. She did not follow up advised to recheck the thyroid level. Reports . Denies .  -Diet: variety of foods, balance and well rounded, larger portion sizes  -Exercise: no regular exercise  -Taking folic acid, vitamin D or calcium: no  -Diabetes and Dyslipidemia Screening:  -Hx of HTN: no  -Vaccines: UTD  -pap history: sees Dr. Henderson CloudHorvath in gyn  -FDLMP: hx irr bleeding and dysmenorrhea - sees Dr. Henderson CloudHorvath  -sexual activity: yes, female partner, no new partners  -wants STI testing (Hep C if born [birth year]-65): no  -FH breast, colon or ovarian ca: see FH Last mammogram: Last colon cancer screening:  Breast Ca Risk Assessment: -MarketVoip.eshttp://www.cancer.gov/bcrisktool  Genetic Counseling Screen: Http://www.breastcancergenesscreen.org/startScreen.aspx  FRAX (50-65):  DEXA (>/= 65):   -Alcohol, Tobacco, drug use: see social history  Review of Systems - no fevers, unintentional weight loss, vision loss, hearing loss, chest pain, sob, hemoptysis, melena, hematochezia, hematuria, genital discharge, changing or concerning skin lesions, bleeding, bruising, loc, thoughts of self harm or SI  Past Medical History:  Diagnosis Date  . Abdominal pain   . Fatigue   . Gastroparesis   . GERD (gastroesophageal reflux disease)   . Headache(784.0)   . Helicobacter pylori (H. pylori)   . Hoarseness   . Hypertension    high blood pressure readings  . Nausea vomiting and diarrhea   . Night sweats   . Obesity   . Rectal bleeding   . Ulcer (HCC)   . Wears glasses     Past Surgical History:  Procedure  Laterality Date  . ESOPHAGOGASTRODUODENOSCOPY  02/2011  . LAPAROSCOPIC CHOLECYSTECTOMY  05/2011  . MOUTH SURGERY  5 years ago  . TONSILLECTOMY AND ADENOIDECTOMY  2002    Family History  Problem Relation Age of Onset  . Hypertension Mother   . Hyperlipidemia Mother   . Alcoholism Father   . Arthritis Father     paternal grandparents  . Diabetes Maternal Grandmother   . Arthritis Maternal Grandmother   . Heart disease Maternal Grandmother   . Diabetes Maternal Aunt   . Alcoholism      maternal grandparents  . Hyperlipidemia      all four grandparents  . Stroke    . Mental illness      father's side  . Heart disease Paternal Grandfather   . Colon cancer Neg Hx     Social History   Social History  . Marital status: Single    Spouse name: N/A  . Number of children: 0  . Years of education: N/A   Occupational History  . Student     Social History Main Topics  . Smoking status: Never Smoker  . Smokeless tobacco: Never Used  . Alcohol use No  . Drug use:     Types: Marijuana     Comment: daily  . Sexual activity: Not on file   Other Topics Concern  . 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  metoCLOPramide (REGLAN) 10 MG tablet, Take 0.5 tablets (5 mg total) by mouth every 6 (six) hours., Disp: 10 tablet, Rfl: 0 .  metroNIDAZOLE (METROGEL VAGINAL) 0.75 % vaginal gel, Place 1 Applicatorful vaginally 2 (two) times daily., Disp: 70 g, Rfl: 0 .  omeprazole (PRILOSEC) 20 MG capsule, Take 1 capsule (20 mg total) by mouth daily., Disp: 31 capsule, Rfl: 0 .  ondansetron (ZOFRAN ODT) 4 MG disintegrating tablet, Take 1 tablet (4 mg total) by mouth every 8 (eight) hours as needed for nausea or  vomiting., Disp: 20 tablet, Rfl: 0 .  prochlorperazine (COMPAZINE) 25 MG suppository, PLACE 1 SUPP RECTALLY EVERY 12 HOURS AS NEEDED FOR NAUSEA AND VOMITING.*FOR INTRACTABLET VOMITING WHEN ORAL MEDS CANNOT BE TAKEN*, Disp: 20 suppository, Rfl: 2  EXAM:  There were no vitals filed for this visit.  GENERAL: vitals reviewed and listed below, alert, oriented, appears well hydrated and in no acute distress  HEENT: head atraumatic, PERRLA, normal appearance of eyes, ears, nose and mouth. moist mucus membranes.  NECK: supple, no masses or lymphadenopathy  LUNGS: clear to auscultation bilaterally, no rales, rhonchi or wheeze  CV: HRRR, no peripheral edema or cyanosis, normal pedal pulses  BREAST: normal appearance - no lesions or discharge, on palpation normal breast tissue without any suspicious masses  ABDOMEN: bowel sounds normal, soft, non tender to palpation, no masses, no rebound or guarding  GU: normal appearance of external genitalia - no lesions or masses, normal vaginal mucosa - no abnormal discharge, normal appearance of cervix - no lesions or abnormal discharge, no masses or tenderness on palpation of uterus and ovaries.  RECTAL: refused  SKIN: no rash or abnormal lesions  MS: normal gait, moves all extremities normally  NEURO: normal gait, speech and thought processing grossly intact, muscle tone grossly intact throughout  PSYCH: normal affect, pleasant and cooperative  ASSESSMENT AND PLAN:  Discussed the following assessment and plan:  No diagnosis found.  -Discussed and advised all [place] preventive services health task force level A and B recommendations for age, sex and risks.  -Advised weight reduction, at least 150 minutes of exercise per week and a healthy diet  -labs (TSH, lipids, hgba1c, cbc) , studies and vaccines (flu and tetanus booster advised) per orders this encounter  No orders of the defined types were placed in this encounter.   Patient advised to  return to clinic immediately if symptoms worsen or persist or new concerns.  There are no Patient Instructions on file for this visit.  No Follow-up on file.  Kriste BasqueKIM, Erastus Bartolomei R., DO

## 2016-10-12 NOTE — Telephone Encounter (Signed)
Patient was a no-show for today's appt.  I left a detailed message at the pts cell number to check on her and asked that she call back to reschedule.

## 2016-10-22 DIAGNOSIS — Z30430 Encounter for insertion of intrauterine contraceptive device: Secondary | ICD-10-CM | POA: Diagnosis not present

## 2016-10-22 DIAGNOSIS — Z3202 Encounter for pregnancy test, result negative: Secondary | ICD-10-CM | POA: Diagnosis not present

## 2016-10-22 DIAGNOSIS — Z30431 Encounter for routine checking of intrauterine contraceptive device: Secondary | ICD-10-CM | POA: Diagnosis not present

## 2016-10-28 ENCOUNTER — Ambulatory Visit: Payer: 59 | Admitting: Gastroenterology

## 2016-10-28 ENCOUNTER — Other Ambulatory Visit: Payer: Self-pay

## 2016-11-06 MED FILL — traMADol HCL 50 MG TABS: 50 | 8 days supply | Qty: 30 | Fill #0

## 2016-11-27 DIAGNOSIS — Z30432 Encounter for removal of intrauterine contraceptive device: Secondary | ICD-10-CM | POA: Diagnosis not present

## 2016-11-27 DIAGNOSIS — Z6841 Body Mass Index (BMI) 40.0 and over, adult: Secondary | ICD-10-CM | POA: Diagnosis not present

## 2016-12-05 DIAGNOSIS — M25571 Pain in right ankle and joints of right foot: Secondary | ICD-10-CM | POA: Diagnosis not present

## 2017-02-02 ENCOUNTER — Encounter: Payer: 59 | Admitting: Family Medicine

## 2017-02-26 ENCOUNTER — Other Ambulatory Visit: Payer: Self-pay | Admitting: Family Medicine

## 2017-04-27 ENCOUNTER — Other Ambulatory Visit: Payer: Self-pay | Admitting: Obstetrics and Gynecology

## 2017-04-27 DIAGNOSIS — Z3480 Encounter for supervision of other normal pregnancy, unspecified trimester: Secondary | ICD-10-CM | POA: Diagnosis not present

## 2017-04-27 DIAGNOSIS — N925 Other specified irregular menstruation: Secondary | ICD-10-CM | POA: Diagnosis not present

## 2017-04-30 DIAGNOSIS — Z0000 Encounter for general adult medical examination without abnormal findings: Secondary | ICD-10-CM | POA: Diagnosis not present

## 2017-05-04 MED FILL — miSOPROStol 200 MCG TABS: 200 | 28 days supply | Qty: 4 | Fill #0

## 2017-05-13 MED FILL — traMADol HCL 50 MG TABS: 50 | 8 days supply | Qty: 30 | Fill #0

## 2017-05-14 ENCOUNTER — Telehealth: Payer: Self-pay | Admitting: *Deleted

## 2017-05-14 NOTE — Telephone Encounter (Signed)
-----   Message from Terressa KoyanagiHannah R Kim, DO sent at 05/14/2017 12:00 PM EDT ----- We recommend she complete the follow up CT on the pulmonary nodules per last CT radiology report recommendations. Can you please let her know and read radiology recommendations to her about the nodules.Then assist in scheduling. Thanks. ----- Message ----- From: SYSTEM Sent: 05/08/2017  12:05 AM To: Terressa KoyanagiHannah R Kim, DO

## 2017-05-14 NOTE — Telephone Encounter (Signed)
I left a message for the pt to return my call. 

## 2017-06-10 NOTE — Telephone Encounter (Signed)
I called the pt and informed her of the message below and she was given the phone number of [phone] and agreed to call for an appt.

## 2017-06-10 NOTE — Telephone Encounter (Signed)
-----   Message from Terressa KoyanagiHannah R Kim, DO sent at 05/14/2017 12:00 PM EDT ----- We recommend she complete the follow up CT on the pulmonary nodules per last CT radiology report recommendations. Can you please let her know and read radiology recommendations to her about the nodules.Then assist in scheduling. Thanks. ----- Message ----- From: SYSTEM Sent: 05/08/2017  12:05 AM To: Terressa KoyanagiHannah R Kim, DO

## 2017-06-13 ENCOUNTER — Ambulatory Visit (HOSPITAL_COMMUNITY): Admission: EM | Admit: 2017-06-13 | Discharge: 2017-06-13 | Discharge status: Against Medical Advice | Payer: 59

## 2017-06-13 DIAGNOSIS — S61012A Laceration without foreign body of left thumb without damage to nail, initial encounter: Secondary | ICD-10-CM | POA: Diagnosis not present

## 2017-10-13 DIAGNOSIS — N925 Other specified irregular menstruation: Secondary | ICD-10-CM | POA: Diagnosis not present

## 2017-10-13 DIAGNOSIS — Z3201 Encounter for pregnancy test, result positive: Secondary | ICD-10-CM | POA: Diagnosis not present

## 2017-12-17 IMAGING — NM NM GASTRIC EMPTYING
4 series · 4 of 4 positions shown · non-contrast
Comparison: None.

CLINICAL DATA: Chronic abdominal pain with nausea and vomiting

EXAM:
NUCLEAR MEDICINE GASTRIC EMPTYING SCAN
TECHNIQUE: After oral ingestion of radiolabeled meal, sequential abdominal
images were obtained for 3 hours. Percentage of activity emptying
the stomach was calculated at 1 hour, 2 hours, and 3 hours.
RADIOPHARMACEUTICALS:  2.2 mCi Vc-WWm sulfur colloid in standardized
meal including egg

[Series 1: 0 min · 4.14mm/px · 1 of 1 slices shown]
[im 1/1]
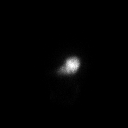

[Series 2: 1 hr · 4.14mm/px · 1 of 1 slices shown]
[im 1/1]
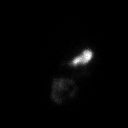

[Series 3: 2 hr · 4.14mm/px · 1 of 1 slices shown]
[im 1/1]
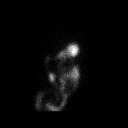

[Series 4: 90 min · 4.14mm/px · 1 of 1 slices shown]
[im 1/1]
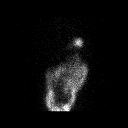

[4 of 4 positions shown; findings below may reference images not displayed]

FINDINGS: Expected location of the stomach in the left upper quadrant.
Ingested meal empties the stomach gradually over the course of the
study.

26% emptied at 1 hr ( normal >= 10%)

62% emptied at 2 hr ( normal >= 40%)

93% emptied at 3 hr ( normal >= 70%)
IMPRESSION: Normal gastric emptying study for solid material.

## 2018-04-07 ENCOUNTER — Encounter: Payer: Self-pay | Admitting: Family Medicine

## 2018-04-15 MED FILL — miSOPROStol 200 MCG TABS: 200 | 1 days supply | Qty: 4 | Fill #0

## 2018-04-19 ENCOUNTER — Encounter: Payer: Self-pay | Admitting: Family Medicine

## 2018-04-19 DIAGNOSIS — Z0289 Encounter for other administrative examinations: Secondary | ICD-10-CM

## 2018-04-19 NOTE — Progress Notes (Deleted)
HPI:  Using dictation device. Unfortunately this device frequently misinterprets words/phrases.  Here for CPE:  -Concerns and/or follow up today:  Chronic medical problems summarized below were reviewed for changes.***. Hx poor compliance with recommendations for follow up and care. Due for labs, CT follow up pulm nodules.  Morbid Obesity:  Gastroparesis: -sees GI for management  Hx Dysmenorrhea: -sees gyn for management  Hx pulm nodules: -we advised f/u per radiology, she did not repeat  -Diet: variety of foods, balance and well rounded, larger portion sizes -Exercise: no regular exercise -Taking folic acid, vitamin D or calcium: no -Diabetes and Dyslipidemia Screening: *** -Vaccines: see vaccine section EPIC -pap history: sees Dr. Henderson Cloud in gyn - pap 10/2016 -FDLMP: see nursing notes -sexual activity: yes, female partner, no new partners -wants STI testing (Hep C if born 72-65): no -FH breast, colon or ovarian ca: see FH Last mammogram: *** Last colon cancer screening: *** Breast Ca Risk Assessment: see family history and pt history DEXA (>/= 68): ***  -Alcohol, Tobacco, drug use: see social history  Review of Systems - no fevers, unintentional weight loss, vision loss, hearing loss, chest pain, sob, hemoptysis, melena, hematochezia, hematuria, genital discharge, changing or concerning skin lesions, bleeding, bruising, loc, thoughts of self harm or SI  Past Medical History:  Diagnosis Date  . Abdominal pain   . Fatigue   . Gastroparesis   . GERD (gastroesophageal reflux disease)   . Headache(784.0)   . Helicobacter pylori (H. pylori)   . Hoarseness   . Hypertension    high blood pressure readings  . Nausea vomiting and diarrhea   . Night sweats   . Obesity   . Rectal bleeding   . Ulcer (HCC)   . Wears glasses     Past Surgical History:  Procedure Laterality Date  . ESOPHAGOGASTRODUODENOSCOPY  02/2011  . LAPAROSCOPIC CHOLECYSTECTOMY  05/2011  . MOUTH  SURGERY  5 years ago  . TONSILLECTOMY AND ADENOIDECTOMY  2002    Family History  Problem Relation Age of Onset  . Hypertension Mother   . Hyperlipidemia Mother   . Alcoholism Father   . Arthritis Father        paternal grandparents  . Diabetes Maternal Grandmother   . Arthritis Maternal Grandmother   . Heart disease Maternal Grandmother   . Diabetes Maternal Aunt   . Alcoholism Unknown        maternal grandparents  . Hyperlipidemia Unknown        all four grandparents  . Stroke Unknown   . Mental illness Unknown        father's side  . Heart disease Paternal Grandfather   . Colon cancer Neg Hx     Social History   Socioeconomic History  . Marital status: Single    Spouse name: Not on file  . Number of children: 0  . Years of education: Not on file  . Highest education level: Not on file  Occupational History  . Occupation: Consulting civil engineer   Social Needs  . Financial resource strain: Not on file  . Food insecurity:    Worry: Not on file    Inability: Not on file  . Transportation needs:    Medical: Not on file    Non-medical: Not on file  Tobacco Use  . Smoking status: Never Smoker  . Smokeless tobacco: Never Used  Substance and Sexual Activity  . Alcohol use: No  . Drug use: Yes    Types: Marijuana    Comment:  daily  . Sexual activity: Not on file  Lifestyle  . Physical activity:    Days per week: Not on file    Minutes per session: Not on file  . Stress: Not on file  Relationships  . Social connections:    Talks on phone: Not on file    Gets together: Not on file    Attends religious service: Not on file    Active member of club or organization: Not on file    Attends meetings of clubs or organizations: Not on file    Relationship status: Not on file  Other Topics Concern  . 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  metoCLOPramide (REGLAN) 10 MG tablet, Take 0.5 tablets (5 mg total) by mouth every 6 (six) hours., Disp: 10 tablet, Rfl: 0 .  metroNIDAZOLE (METROGEL) 0.75 % vaginal gel, PLACE 1 APPLICATORFUL VAGINALLY 2 TIMES DAILY., Disp: 70 g, Rfl: 0 .  omeprazole (PRILOSEC) 20 MG capsule, Take 1 capsule (20 mg total) by mouth daily., Disp: 31 capsule, Rfl: 0 .  ondansetron (ZOFRAN ODT) 4 MG disintegrating tablet, Take 1 tablet (4 mg total) by mouth every 8 (eight) hours as needed for nausea or vomiting., Disp: 20 tablet, Rfl: 0 .  prochlorperazine (COMPAZINE) 25 MG suppository, PLACE 1 SUPP RECTALLY EVERY 12 HOURS AS NEEDED FOR NAUSEA AND VOMITING.*FOR INTRACTABLET VOMITING WHEN ORAL MEDS CANNOT BE TAKEN*, Disp: 20 suppository, Rfl: 2  EXAM:  There were no vitals filed for this visit.  GENERAL: vitals reviewed and listed below, alert, oriented, appears well hydrated and in no acute distress  HEENT: head atraumatic, PERRLA, normal appearance of eyes, ears, nose and mouth. moist mucus membranes.  NECK: supple, no masses or lymphadenopathy  LUNGS: clear to auscultation bilaterally, no rales, rhonchi or wheeze  CV: HRRR, no peripheral edema or cyanosis, normal pedal pulses  ABDOMEN: bowel sounds normal, soft, non tender to palpation, no masses, no rebound or guarding  GU/BREAST: ***  SKIN: no rash or abnormal lesions  MS: normal gait, moves all extremities normally  NEURO: normal gait, speech and thought processing grossly intact, muscle tone grossly intact throughout  PSYCH: normal affect, pleasant and cooperative  ASSESSMENT AND PLAN:  Discussed the following assessment and plan:  PREVENTIVE EXAM: -Discussed and advised all [place] preventive services health task force level A and B recommendations for age, sex and risks. -Advised at least 150 minutes of  exercise per week and a healthy diet with avoidance of (less then 1 serving per week) processed foods, white starches, red meat, fast foods and sweets and consisting of: * 5-9 servings of fresh fruits and vegetables (not corn or potatoes) *nuts and seeds, beans *olives and olive oil *lean meats such as fish and white chicken  *whole grains -labs, studies and vaccines per orders this encounter  There are no diagnoses linked to this encounter. ***  Patient advised to return to clinic immediately if symptoms worsen or persist or new concerns.  There are no Patient Instructions on file for this visit.  No follow-ups on  file.  Terressa Koyanagi, DO

## 2018-05-17 MED FILL — XULANE PATCH: 150-35 | 28 days supply | Qty: 3 | Fill #0

## 2018-06-07 MED FILL — ESTRADIOL 1 MG TABLET: 1 | 14 days supply | Qty: 14 | Fill #0

## 2018-06-13 MED FILL — XULANE PATCH: 150-35 | 28 days supply | Qty: 3 | Fill #1

## 2018-07-21 NOTE — Progress Notes (Signed)
HPI:  Using dictation device. Unfortunately this device frequently misinterprets words/phrases.  Miranda Hawkins is a pleasant 22 yo whom we have not seen in quite some time here for an acute visit for sweating. PMH of morbid obesity, Gastroparesis (sees GI), Dysmenorrhea (sees gyn), and pulm nodule (we advised and ordered follow up CT, but she did not complete despite us requesting and advising her to do so.) She reports focal hyperhidrosis bilat axillary chronic and has tried a number of antiperspirants, but skin is easily irritated. She would like to try the new "wipes" for this. No sweating elsewhere, fevers, malaise. Reports otherwise is doing quit well. Working new job for [employer] and loves it. Doing great otherwise, has lost a lot of weight an dbowel have been "great".  ROS: See pertinent positives and negatives per HPI.  Past Medical History:  Diagnosis Date  . Abdominal pain   . Fatigue   . Gastroparesis   . GERD (gastroesophageal reflux disease)   . Headache(784.0)   . Helicobacter pylori (H. pylori)   . Hoarseness   . Hypertension    high blood pressure readings  . Nausea vomiting and diarrhea   . Night sweats   . Obesity   . Rectal bleeding   . Ulcer   . Wears glasses     Past Surgical History:  Procedure Laterality Date  . ESOPHAGOGASTRODUODENOSCOPY  02/2011  . LAPAROSCOPIC CHOLECYSTECTOMY  05/2011  . MOUTH SURGERY  5 years ago  . TONSILLECTOMY AND ADENOIDECTOMY  2002    Family History  Problem Relation Age of Onset  . Hypertension Mother   . Hyperlipidemia Mother   . Alcoholism Father   . Arthritis Father        paternal grandparents  . Diabetes Maternal Grandmother   . Arthritis Maternal Grandmother   . Heart disease Maternal Grandmother   . Diabetes Maternal Aunt   . Alcoholism Unknown        maternal grandparents  . Hyperlipidemia Unknown        all four grandparents  . Stroke Unknown   . Mental illness Unknown        father's side  . Heart disease  Paternal Grandfather   . Colon cancer Neg Hx     SOCIAL HX: see hpi  No current outpatient medications on file.  EXAM:  Vitals:   07/25/18 0820  BP: 102/76  Pulse: 86  Temp: 98.5 F (36.9 C)    Body mass index is 29.96 kg/m.  GENERAL: vitals reviewed and listed above, alert, oriented, appears well hydrated and in no acute distress  HEENT: atraumatic, conjunttiva clear, no obvious abnormalities on inspection of external nose and ears  NECK: no obvious masses on inspection  LUNGS: clear to auscultation bilaterally, no wheezes, rales or rhonchi, good air movement  CV: HRRR, no peripheral edema  MS: moves all extremities without noticeable abnormality  PSYCH: pleasant and cooperative, no obvious depression or anxiety  ASSESSMENT AND PLAN:  Discussed the following assessment and plan:  Hyperhidrosis -discussed tx options, it seems antiperspirants have been very irritative and this is very bothersome -opted to try qbrexxa per her preferencse - written rx provided -follow up and CPE in 3 months  Pulmonary nodules - Plan: CT Chest Wo Contrast Solitary pulmonary nodule - Plan: CT Chest Wo Contrast -advised to repeat low dose CT per radiology recs -she agrees to do so - assistant to place orders  Hx of Obesity/Gastroparesis. -reports doing great  -Patient advised to return or  notify a doctor immediately if symptoms worsen or persist or new concerns arise.  Patient Instructions  Follow up in 3 months for CPE     Terressa KoyanagiHannah R Kim, DO

## 2018-07-25 ENCOUNTER — Ambulatory Visit (INDEPENDENT_AMBULATORY_CARE_PROVIDER_SITE_OTHER): Payer: No Typology Code available for payment source | Admitting: Family Medicine

## 2018-07-25 ENCOUNTER — Encounter: Payer: Self-pay | Admitting: Family Medicine

## 2018-07-25 VITALS — BP 102/76 | HR 86 | Temp 98.5°F | Ht 66.0 in | Wt 185.6 lb

## 2018-07-25 DIAGNOSIS — R911 Solitary pulmonary nodule: Secondary | ICD-10-CM

## 2018-07-25 DIAGNOSIS — R61 Generalized hyperhidrosis: Secondary | ICD-10-CM | POA: Diagnosis not present

## 2018-07-25 DIAGNOSIS — R918 Other nonspecific abnormal finding of lung field: Secondary | ICD-10-CM | POA: Diagnosis not present

## 2018-07-25 NOTE — Patient Instructions (Signed)
Follow up in 3 months for CPE

## 2018-07-26 ENCOUNTER — Telehealth: Payer: Self-pay | Admitting: *Deleted

## 2018-07-26 NOTE — Telephone Encounter (Signed)
Prior auth for Qbrexza 2.4% pads sent to [email or web address]-key [id number].

## 2018-08-01 ENCOUNTER — Telehealth: Payer: Self-pay | Admitting: Family Medicine

## 2018-08-01 NOTE — Telephone Encounter (Signed)
Please let patient know about the denial from insurance and why.  see if she tried prescription strength aluminum chloride prior? If so, please complete prior auth. If not see if she wants to try. Thanks.

## 2018-08-01 NOTE — Telephone Encounter (Signed)
Prior auth previously sent on 8/20.  Message in [email or web address] state the request was denied as the insurance requires that the pt have tried a prescription strength aluminum chloride product (such as Drysol) for approval.  Message sent to Dr Selena BattenKim.

## 2018-08-01 NOTE — Telephone Encounter (Signed)
Copied from CRM [id number]. Topic: Quick Communication - Rx Refill/Question >> Aug 01, 2018  9:43 AM Darletta MollLander, Lumin L wrote: Medication: Qbrexza 2.4% (medication was denied on 08/22 after PA was sent on 08/22 because she had not tried another medication)  Has the patient contacted their pharmacy? Yes.   (Agent: If no, request that the patient contact the pharmacy for the refill.) (Agent: If yes, when and what did the pharmacy advise?)  Preferred Pharmacy (with phone number or street name): [facility] with members insurance

## 2018-08-01 NOTE — Telephone Encounter (Signed)
I left a message for the pt to return my call.  CRM also created. 

## 2018-08-01 NOTE — Telephone Encounter (Signed)
Pt called back to verify that the insurance would need only a verbal authorization to get medication filled; insurance had questions to have answered

## 2018-08-04 ENCOUNTER — Other Ambulatory Visit: Payer: Self-pay | Admitting: Family Medicine

## 2018-08-04 MED ORDER — ALUMINUM CHLORIDE 20 % EX SOLN: CUTANEOUS | 0 refills | Status: DC

## 2018-08-04 MED FILL — DRYSOL DAB-O-MATIC SOLUTION: 20 | 30 days supply | Qty: 35 | Fill #0

## 2018-08-04 NOTE — Telephone Encounter (Signed)
I left a detailed message at the pts cell number with the information below. 

## 2018-08-04 NOTE — Telephone Encounter (Signed)
Please call pt. I am sorry she is upset... Insurance formularies and rules also are frustrating for us. We will send in the Drysol to try.

## 2018-08-04 NOTE — Telephone Encounter (Signed)
I spoke with pt, she is very upset, says she returned a call here to the office on 08/01/2018 late afternoon and there is no note of that. She would like the alternative medication sent in today to [facility].

## 2018-08-09 ENCOUNTER — Inpatient Hospital Stay: Admission: RE | Admit: 2018-08-09 | Payer: No Typology Code available for payment source | Source: Ambulatory Visit

## 2018-08-12 ENCOUNTER — Inpatient Hospital Stay: Admission: RE | Admit: 2018-08-12 | Payer: No Typology Code available for payment source | Source: Ambulatory Visit

## 2018-08-15 ENCOUNTER — Inpatient Hospital Stay: Admission: RE | Admit: 2018-08-15 | Payer: No Typology Code available for payment source | Source: Ambulatory Visit

## 2018-08-18 MED FILL — metroNIDAZOLE 500 MG TABS: 500 | 7 days supply | Qty: 14 | Fill #0

## 2018-08-18 MED FILL — FLUCONAZOLE 150 MG TABS: 150 | 4 days supply | Qty: 2 | Fill #0

## 2018-10-10 NOTE — Progress Notes (Deleted)
HPI:  Using dictation device. Unfortunately this device frequently misinterprets words/phrases.  Here for CPE:  -Concerns and/or follow up today: none ***  Chronic medical problems summarized below were reviewed for changes.***.   -Diet: variety of foods, balance and well rounded, larger portion sizes -Exercise: no regular exercise -Taking folic acid, vitamin D or calcium: no -Diabetes and Dyslipidemia Screening: *** -Vaccines: see vaccine section EPIC -pap history: neg 11/17 with Dr. Henderson Cloud -FDLMP: see nursing notes -sexual activity: *** -wants STI testing (Hep C if born [birth year]-65): no -FH breast, colon or ovarian ca: see FH Last mammogram: *** Last colon cancer screening: *** Breast Ca Risk Assessment: see family history and pt history DEXA (>/= 1): ***  -Alcohol, Tobacco, drug use: see social history  Review of Systems - no fevers, unintentional weight loss, vision loss, hearing loss, chest pain, sob, hemoptysis, melena, hematochezia, hematuria, genital discharge, changing or concerning skin lesions, bleeding, bruising, loc, thoughts of self harm or SI  Past Medical History:  Diagnosis Date  . Abdominal pain   . Fatigue   . Gastroparesis   . GERD (gastroesophageal reflux disease)   . Headache(784.0)   . Helicobacter pylori (H. pylori)   . Hoarseness   . Hypertension    high blood pressure readings  . Nausea vomiting and diarrhea   . Night sweats   . Obesity   . Rectal bleeding   . Ulcer   . Wears glasses     Past Surgical History:  Procedure Laterality Date  . ESOPHAGOGASTRODUODENOSCOPY  02/2011  . LAPAROSCOPIC CHOLECYSTECTOMY  05/2011  . MOUTH SURGERY  5 years ago  . TONSILLECTOMY AND ADENOIDECTOMY  2002    Family History  Problem Relation Age of Onset  . Hypertension Mother   . Hyperlipidemia Mother   . Alcoholism Father   . Arthritis Father        paternal grandparents  . Diabetes Maternal Grandmother   . Arthritis Maternal Grandmother   . Heart  disease Maternal Grandmother   . Diabetes Maternal Aunt   . Alcoholism Unknown        maternal grandparents  . Hyperlipidemia Unknown        all four grandparents  . Stroke Unknown   . Mental illness Unknown        father's side  . Heart disease Paternal Grandfather   . Colon cancer Neg Hx     Social History   Socioeconomic History  . Marital status: Single    Spouse name: Not on file  . Number of children: 0  . Years of education: Not on file  . Highest education level: Not on file  Occupational History  . Occupation: Consulting civil engineer   Social Needs  . Financial resource strain: Not on file  . Food insecurity:    Worry: Not on file    Inability: Not on file  . Transportation needs:    Medical: Not on file    Non-medical: Not on file  Tobacco Use  . Smoking status: Never Smoker  . Smokeless tobacco: Never Used  Substance and Sexual Activity  . Alcohol use: No  . Drug use: Yes    Types: Marijuana    Comment: daily  . Sexual activity: Not on file  Lifestyle  . Physical activity:    Days per week: Not on file    Minutes per session: Not on file  . Stress: Not on file  Relationships  . Social connections:    Talks on phone: Not on file  Gets together: Not on file    Attends religious service: Not on file    Active member of club or organization: Not on file    Attends meetings of clubs or organizations: Not on file    Relationship status: Not on file  Other Topics Concern  . Not on file  Social History Narrative   Daily caffeine       Work or School: going to start truck driving school - in [employer] Situation: lives with mom      Spiritual Beliefs: none      Lifestyle: no regular exercise; diet is fair - poor appete                 Current Outpatient Medications:  .  aluminum chloride (DRYSOL) 20 % external solution, Apply topically 2 (two) times a week. Apply small amount to underarms 2 nights per week. Do not apply to broken skin., Disp: 35  mL, Rfl: 0  EXAM:  There were no vitals filed for this visit.  GENERAL: vitals reviewed and listed below, alert, oriented, appears well hydrated and in no acute distress  HEENT: head atraumatic, PERRLA, normal appearance of eyes, ears, nose and mouth. moist mucus membranes.  NECK: supple, no masses or lymphadenopathy  LUNGS: clear to auscultation bilaterally, no rales, rhonchi or wheeze  CV: HRRR, no peripheral edema or cyanosis, normal pedal pulses  ABDOMEN: bowel sounds normal, soft, non tender to palpation, no masses, no rebound or guarding  GU/BREAST: ***  SKIN: no rash or abnormal lesions  MS: normal gait, moves all extremities normally  NEURO: normal gait, speech and thought processing grossly intact, muscle tone grossly intact throughout  PSYCH: normal affect, pleasant and cooperative  ASSESSMENT AND PLAN:  Discussed the following assessment and plan:  PREVENTIVE EXAM: -Discussed and advised all [place] preventive services health task force level A and B recommendations for age, sex and risks. -Advised at least 150 minutes of exercise per week and a healthy diet with avoidance of (less then 1 serving per week) processed foods, white starches, red meat, fast foods and sweets and consisting of: * 5-9 servings of fresh fruits and vegetables (not corn or potatoes) *nuts and seeds, beans *olives and olive oil *lean meats such as fish and white chicken  *whole grains -labs, studies and vaccines per orders this encounter  There are no diagnoses linked to this encounter. ***  Patient advised to return to clinic immediately if symptoms worsen or persist or new concerns.  There are no Patient Instructions on file for this visit.  No follow-ups on file.  Terressa Koyanagi, DO

## 2018-10-11 ENCOUNTER — Encounter: Payer: No Typology Code available for payment source | Admitting: Family Medicine

## 2018-10-11 DIAGNOSIS — Z0289 Encounter for other administrative examinations: Secondary | ICD-10-CM

## 2018-11-25 ENCOUNTER — Ambulatory Visit (INDEPENDENT_AMBULATORY_CARE_PROVIDER_SITE_OTHER): Payer: Self-pay | Admitting: Physician Assistant

## 2018-11-25 ENCOUNTER — Encounter: Payer: Self-pay | Admitting: Physician Assistant

## 2018-11-25 VITALS — BP 110/65 | HR 70 | Temp 97.4°F | Resp 16 | Wt 187.0 lb

## 2018-11-25 DIAGNOSIS — B9789 Other viral agents as the cause of diseases classified elsewhere: Secondary | ICD-10-CM

## 2018-11-25 DIAGNOSIS — J069 Acute upper respiratory infection, unspecified: Secondary | ICD-10-CM

## 2018-11-25 MED ORDER — OXYMETAZOLINE HCL 0.05 % NA SOLN: 1.0000 | Freq: Two times a day (BID) | NASAL | 0 refills | Status: DC

## 2018-11-25 MED ORDER — PSEUDOEPHEDRINE HCL 60 MG PO TABS: 60.0000 mg | ORAL_TABLET | Freq: Three times a day (TID) | ORAL | 0 refills | Status: DC | PRN

## 2018-11-25 MED ORDER — BENZONATATE 100 MG PO CAPS: 100.0000 mg | ORAL_CAPSULE | Freq: Two times a day (BID) | ORAL | 0 refills | Status: DC | PRN

## 2018-11-25 MED ORDER — PROMETHAZINE-DM 6.25-15 MG/5ML PO SYRP: 5.0000 mL | ORAL_SOLUTION | Freq: Four times a day (QID) | ORAL | 0 refills | Status: DC | PRN

## 2018-11-25 MED FILL — PROMETHAZINE W/DM SYRUP: 6.25-15 | 6 days supply | Qty: 118 | Fill #0

## 2018-11-25 MED FILL — BENZONATATE 100 MG CAPS: 100 | 10 days supply | Qty: 20 | Fill #0

## 2018-11-25 NOTE — Patient Instructions (Signed)
- We will treat this as a viral upper respiratory infection, more commonly known as the common cold.  Most colds last for three to seven days, although many people have residual symptoms such as coughing, sneezing, nasal or chest congestion for up to two weeks. Some viruses that cause the common cold can also depress the immune system or cause swelling in the lining of the nose or airways; this can, in turn, lead to a secondary viral or bacterial infection. Please return to clinic if you are not seeing any improvement in  5-7 days or sooner if any symptoms worsen or you develop new concerning symptoms.   -There is no specific treatment for the viruses that cause the common cold. Most treatments are aimed at relieving some of the symptoms of the cold but do not shorten or cure the cold. Antibiotics are not useful for treating the common cold; antibiotics are only used to treat illnesses caused by bacteria, not viruses. Unnecessary use of antibiotics for the treatment of the common cold can cause allergic reactions, diarrhea, or other gastrointestinal symptoms in some patients.  - For now, I recommend you rest, drink plenty of fluids, eat light meals, including soups, and use supportive measures discussed below.   Runny nose and nasal congestion - Runny nose and congestion may improve with the use of nasal inhalers. I have given you a prescription for Afrin, please use daily as needed for the next three days. Also, take oral sudafed. You can use over the counter saline nasal sprays at night time.   Sore throat, headache, and hoarse voice - Sore throat and headache are best treated with a mild pain reliever such as over the counter Tylenol, ibuprofen, Motrin or Aleve. You may also use throat lozenges. Tea recipe for sore throat: boil water, add 2 inches shaved ginger root, steep 15 minutes, add juice from 2 full lemons, and 2 tbsp honey.  Cough - You may use over the counter or prescription cough syrup at  night for your cough. Over the counter mucinex-DM is an expectorant and will help thin the mucus, making it easier to clear from the head, throat, and lungs.Prescription Tessalon perles can be used during the day to help suppress the cough.  Be aware that some cough syrups can make you drowsy and sleepy so do not drive or operate any heavy machinery if it is affecting you during the day.  -Some homemade cough syrups can be just as effective as the over-the-counter medications with much fewer side effects. Also they can be combined with over-the-counter and prescription cough regimens, to make them even more effective together. Sweet lemon, honey, and thyme cough syrup recipe: Place half of a chopped lemon in a container and cover with half a cup of honey (raw is best but any will do). Then boil a handful of fresh thyme sprigs or organic dry leaves with 2 cups of water uncovered until it is reduced to one cup. Strain this liquid into the jar with the lemon and honey to remove the steams and leaves and then shake it up. Use 1 teaspoonful as often as needed and it will store in the refrigerator for at least a month.    Antibiotics should not be used to treat an uncomplicated common cold. As noted above, colds are caused by viruses. Antibiotics treat bacterial, not viral infections.  PREVENTION Hand washing is an essential and highly effective way to prevent the spread of most infections, including the common cold.  Hands should be wet with water and plain soap, and rubbed together for 15 to 30 seconds. Special attention should be paid to the fingernails, between the fingers, and the wrists. Hands should be rinsed thoroughly and dried with a single-use towel.In addition, tissues should be used to cover the mouth when sneezing or coughing. These used tissues should be disposed of promptly. Sneezing/coughing into the sleeve of one's clothing (at the inner elbow) does not contaminate the hands and is a good way of  containing sprays of saliva and secretions.    Upper Respiratory Infection, Adult An upper respiratory infection (URI) affects the nose, throat, and upper air passages. URIs are caused by germs (viruses). The most common type of URI is often called "the common cold." Medicines cannot cure URIs, but you can do things at home to relieve your symptoms. URIs usually get better within 7-10 days. Follow these instructions at home: Activity  Rest as needed.  If you have a fever, stay home from work or school until your fever is gone, or until your doctor says you may return to work or school. ? You should stay home until you cannot spread the infection anymore (you are not contagious). ? Your doctor may have you wear a face mask so you have less risk of spreading the infection. Relieving symptoms  Gargle with a salt-water mixture 3-4 times a day or as needed. To make a salt-water mixture, completely dissolve -1 tsp of salt in 1 cup of warm water.  Use a cool-mist humidifier to add moisture to the air. This can help you breathe more easily. Eating and drinking   Drink enough fluid to keep your pee (urine) pale yellow.  Eat soups and other clear broths. General instructions   Take over-the-counter and prescription medicines only as told by your doctor. These include cold medicines, fever reducers, and cough suppressants.  Do not use any products that contain nicotine or tobacco. These include cigarettes and e-cigarettes. If you need help quitting, ask your doctor.  Avoid being where people are smoking (avoid secondhand smoke).  Make sure you get regular shots and get the flu shot every year.  Keep all follow-up visits as told by your doctor. This is important. How to avoid spreading infection to others   Wash your hands often with soap and water. If you do not have soap and water, use hand sanitizer.  Avoid touching your mouth, face, eyes, or nose.  Cough or sneeze into a tissue  or your sleeve or elbow. Do not cough or sneeze into your hand or into the air. Contact a doctor if:  You are getting worse, not better.  You have any of these: ? A fever. ? Chills. ? Brown or red mucus in your nose. ? Yellow or brown fluid (discharge)coming from your nose. ? Pain in your face, especially when you bend forward. ? Swollen neck glands. ? Pain with swallowing. ? White areas in the back of your throat. Get help right away if:  You have shortness of breath that gets worse.  You have very bad or constant: ? Headache. ? Ear pain. ? Pain in your forehead, behind your eyes, and over your cheekbones (sinus pain). ? Chest pain.  You have long-lasting (chronic) lung disease along with any of these: ? Wheezing. ? Long-lasting cough. ? Coughing up blood. ? A change in your usual mucus.  You have a stiff neck.  You have changes in your: ? Vision. ? Hearing. ? Thinking. ?  Mood. Summary  An upper respiratory infection (URI) is caused by a germ called a virus. The most common type of URI is often called "the common cold."  URIs usually get better within 7-10 days.  Take over-the-counter and prescription medicines only as told by your doctor. This information is not intended to replace advice given to you by your health care provider. Make sure you discuss any questions you have with your health care provider. Document Released: 05/11/2008 Document Revised: 07/16/2017 Document Reviewed: 07/16/2017 Elsevier Interactive Patient Education  2019 [employer].

## 2018-11-25 NOTE — Progress Notes (Signed)
MRN: [id number] DOB: 01/31/1996  Subjective:   Miranda Hawkins is a 22 y.o. female presenting for chief complaint of Nasal Congestion (x 4 days); Cough (x 4 days); Chills (x 4 days); and Headache (x 4 days) .  Reports 4 day history of gradual onset illness. Has nasal congestion, scratchy throat, chills, headache, sinus pressure, ear fullness, dry cough, hoarse voice. Denies fever, SOB, wheezing, hemoptysis, chest pain, visual disturbance, night sweats, nausea, vomiting, abdominal pain and diarrhea. Has not tried anything for relief.  No known sick exposures. No PMH of seasonal allergies or asthma. Patient has not had flu shot this season. No tobacco use. Currently on menstrual cycle. PSH of tonsillectomy.  Denies any other aggravating or relieving factors, no other questions or concerns.  Review of Systems  HENT: Negative for tinnitus.   Eyes: Negative for blurred vision, double vision and photophobia.  Neurological: Negative for dizziness, tremors and focal weakness.     Kaelei has a current medication list which includes the following prescription(s): aluminum chloride. Also has No Known Allergies.  Afnan  has a past medical history of Abdominal pain, Fatigue, Gastroparesis, GERD (gastroesophageal reflux disease), Headache(784.0), Helicobacter pylori (H. pylori), Hoarseness, Hypertension, Nausea vomiting and diarrhea, Night sweats, Obesity, Rectal bleeding, Ulcer, and Wears glasses. Also  has a past surgical history that includes Tonsillectomy and adenoidectomy (2002); Mouth surgery (5 years ago); Laparoscopic cholecystectomy (05/2011); and Esophagogastroduodenoscopy (02/2011).   Objective:   Vitals: BP 110/65 (BP Location: Right Arm, Patient Position: Sitting, Cuff Size: Normal)   Pulse 70   Temp (!) 97.4 F (36.3 C) (Oral)   Resp 16   Wt 187 lb (84.8 kg)   SpO2 98%   BMI 30.18 kg/m   Physical Exam Vitals signs reviewed.  Constitutional:      General: She is not in acute  distress.    Appearance: She is well-developed. She is not ill-appearing or toxic-appearing.  HENT:     Head: Normocephalic and atraumatic.     Right Ear: Tympanic membrane, ear canal and external ear normal.     Left Ear: Tympanic membrane, ear canal and external ear normal.     Nose: Mucosal edema, congestion and rhinorrhea present.     Right Turbinates: Enlarged.     Left Turbinates: Enlarged.     Right Sinus: No maxillary sinus tenderness or frontal sinus tenderness.     Left Sinus: No maxillary sinus tenderness or frontal sinus tenderness.     Mouth/Throat:     Pharynx: Uvula midline. Posterior oropharyngeal erythema present.     Tonsils: Swelling: 0 on the right. 0 on the left.     Comments: S/p tonsillectomy.  Hoarse voice noted. Eyes:     Extraocular Movements: Extraocular movements intact.     Conjunctiva/sclera: Conjunctivae normal.     Pupils: Pupils are equal, round, and reactive to light.  Neck:     Musculoskeletal: Normal range of motion.  Cardiovascular:     Rate and Rhythm: Normal rate and regular rhythm.     Heart sounds: Normal heart sounds.  Pulmonary:     Effort: Pulmonary effort is normal. No tachypnea or respiratory distress.     Breath sounds: Normal breath sounds. No decreased breath sounds, wheezing, rhonchi or rales.  Lymphadenopathy:     Head:     Right side of head: No submental, submandibular, tonsillar, preauricular, posterior auricular or occipital adenopathy.     Left side of head: No submental, submandibular, tonsillar, preauricular, posterior auricular or occipital adenopathy.  Cervical: No cervical adenopathy.     Upper Body:     Right upper body: No supraclavicular adenopathy.     Left upper body: No supraclavicular adenopathy.  Skin:    General: Skin is warm and dry.  Neurological:     Mental Status: She is alert and oriented to person, place, and time.     No results found for this or any previous visit (from the past 24  hour(s)).  Assessment and Plan :  1. Viral URI with cough -Pt overall well appearing, NAD. VSS. Lungs CTAB. Hx, duration, and PE findings consistent with viral URI. - Advised supportive care, offered symptomatic relief. - Return to clinic if symptoms worsen or fail to improve in 5-7 days, otherwise return to clinic as needed. - oxymetazoline (AFRIN) 0.05 % nasal spray; Place 1 spray into both nostrils 2 (two) times daily.  Dispense: 30 mL; Refill: 0 - pseudoephedrine (SUDAFED) 60 MG tablet; Take 1 tablet (60 mg total) by mouth every 8 (eight) hours as needed for congestion.  Dispense: 30 tablet; Refill: 0 - benzonatate (TESSALON) 100 MG capsule; Take 1 capsule (100 mg total) by mouth 2 (two) times daily as needed for cough.  Dispense: 20 capsule; Refill: 0 - promethazine-dextromethorphan (PROMETHAZINE-DM) 6.25-15 MG/5ML syrup; Take 5 mLs by mouth 4 (four) times daily as needed for cough.  Dispense: 118 mL; Refill: 0   Benjiman CoreBrittany Shantina Chronister, Cordelia Poche-C  [facility] 11/25/2018 12:49 PM

## 2018-12-21 MED FILL — ETONOGESTREL-ETHINYL ESTRAD: 0.12-0.015 | 84 days supply | Qty: 3 | Fill #0

## 2018-12-26 MED FILL — AZITHROMYCIN 500 MG TABLET: 500 | 1 days supply | Qty: 2 | Fill #0

## 2019-01-25 LAB — HM PAP SMEAR

## 2019-01-25 MED FILL — metroNIDAZOLE 500 MG TABS: 500 | 7 days supply | Qty: 14 | Fill #0

## 2019-02-01 ENCOUNTER — Encounter: Payer: Self-pay | Admitting: Family Medicine

## 2020-03-04 MED FILL — valACYclovir HCL 1 GM TABS: 1 | 7 days supply | Qty: 14 | Fill #0

## 2020-03-22 MED FILL — valACYclovir HCL 1 GM TABS: 1 | 7 days supply | Qty: 14 | Fill #1

## 2020-04-09 MED FILL — valACYclovir HCL 1 GM TABS: 1 | 7 days supply | Qty: 14 | Fill #2

## 2020-04-24 ENCOUNTER — Other Ambulatory Visit: Payer: Self-pay

## 2020-04-25 ENCOUNTER — Encounter: Payer: Self-pay | Admitting: Internal Medicine

## 2020-04-25 ENCOUNTER — Ambulatory Visit (INDEPENDENT_AMBULATORY_CARE_PROVIDER_SITE_OTHER): Payer: No Typology Code available for payment source | Admitting: Internal Medicine

## 2020-04-25 VITALS — BP 110/70 | HR 69 | Temp 97.1°F | Wt 230.2 lb

## 2020-04-25 DIAGNOSIS — L731 Pseudofolliculitis barbae: Secondary | ICD-10-CM | POA: Diagnosis not present

## 2020-04-25 NOTE — Progress Notes (Signed)
Acute Office Visit     This visit occurred during the SARS-CoV-2 public health emergency.  Safety protocols were in place, including screening questions prior to the visit, additional usage of staff PPE, and extensive cleaning of exam room while observing appropriate contact time as indicated for disinfecting solutions.    CC/Reason for Visit: Ingrown pubic hair  HPI: Miranda Hawkins is a 24 y.o. female who is coming in today for the above mentioned reasons. She usually shaves her vulvar area. Has an ingrown hair on her mons. Last week she was able to express pus from it. No fever.   Past Medical/Surgical History: Past Medical History:  Diagnosis Date  . Abdominal pain   . Fatigue   . Gastroparesis   . GERD (gastroesophageal reflux disease)   . Headache(784.0)   . Helicobacter pylori (H. pylori)   . Hoarseness   . Hypertension    high blood pressure readings  . Nausea vomiting and diarrhea   . Night sweats   . Obesity   . Rectal bleeding   . Ulcer   . Wears glasses     Past Surgical History:  Procedure Laterality Date  . ESOPHAGOGASTRODUODENOSCOPY  02/2011  . LAPAROSCOPIC CHOLECYSTECTOMY  05/2011  . MOUTH SURGERY  5 years ago  . TONSILLECTOMY AND ADENOIDECTOMY  2002    Social History:  reports that she has never smoked. She has never used smokeless tobacco. She reports current drug use. Drug: Marijuana. She reports that she does not drink alcohol.  Allergies: No Known Allergies  Family History:  Family History  Problem Relation Age of Onset  . Hypertension Mother   . Hyperlipidemia Mother   . Alcoholism Father   . Arthritis Father        paternal grandparents  . Diabetes Maternal Grandmother   . Arthritis Maternal Grandmother   . Heart disease Maternal Grandmother   . Diabetes Maternal Aunt   . Alcoholism Unknown        maternal grandparents  . Hyperlipidemia Unknown        all four grandparents  . Stroke Unknown   . Mental illness Unknown    father's side  . Heart disease Paternal Grandfather   . Colon cancer Neg Hx     No current outpatient medications on file.  Review of Systems:  Constitutional: Denies fever, chills, diaphoresis, appetite change and fatigue.  HEENT: Denies photophobia, eye pain, redness, hearing loss, ear pain, congestion, sore throat, rhinorrhea, sneezing, mouth sores, trouble swallowing, neck pain, neck stiffness and tinnitus.   Respiratory: Denies SOB, DOE, cough, chest tightness,  and wheezing.   Cardiovascular: Denies chest pain, palpitations and leg swelling.  Gastrointestinal: Denies nausea, vomiting, abdominal pain, diarrhea, constipation, blood in stool and abdominal distention.  Genitourinary: Denies dysuria, urgency, frequency, hematuria, flank pain and difficulty urinating.  Endocrine: Denies: hot or cold intolerance, sweats, changes in hair or nails, polyuria, polydipsia. Musculoskeletal: Denies myalgias, back pain, joint swelling, arthralgias and gait problem.  Skin: Denies pallor, rash and wound.  Neurological: Denies dizziness, seizures, syncope, weakness, light-headedness, numbness and headaches.  Hematological: Denies adenopathy. Easy bruising, personal or family bleeding history  Psychiatric/Behavioral: Denies suicidal ideation, mood changes, confusion, nervousness, sleep disturbance and agitation    Physical Exam: Vitals:   04/25/20 0706  BP: 110/70  Pulse: 69  Temp: (!) 97.1 F (36.2 C)  TempSrc: Temporal  SpO2: 99%  Weight: 230 lb 3.2 oz (104.4 kg)    Body mass index is 37.16 kg/m.  Constitutional: NAD, calm, comfortable Eyes: PERRL, lids and conjunctivae normal ENMT: Mucous membranes are moist.   Skin: small area of bruising and induration on her mons with a visible ingrown hair Neurologic: grossly intact and nonfocal Psychiatric: Normal judgment and insight. Alert and oriented x 3. Normal mood.    Impression and Plan:  Ingrown hair -Advised daily exfoliation  and cessation of shaving. -No need for abx or I and D today.    Chaya Jan, MD [facility]

## 2020-07-26 ENCOUNTER — Encounter: Payer: Self-pay | Admitting: Internal Medicine

## 2020-07-26 DIAGNOSIS — Z0289 Encounter for other administrative examinations: Secondary | ICD-10-CM

## 2021-06-12 ENCOUNTER — Other Ambulatory Visit (HOSPITAL_COMMUNITY): Payer: Self-pay

## 2021-06-12 MED ORDER — DOXYCYCLINE HYCLATE 100 MG PO CAPS
100.0000 mg | ORAL_CAPSULE | Freq: Two times a day (BID) | ORAL | 0 refills | Status: DC
  Filled 2021-06-12: qty 14, 7d supply, fill #0

## 2021-08-21 ENCOUNTER — Other Ambulatory Visit (HOSPITAL_COMMUNITY): Payer: Self-pay

## 2021-08-21 MED ORDER — MISOPROSTOL 200 MCG PO TABS
ORAL_TABLET | ORAL | 1 refills | Status: DC
  Filled 2021-08-21: qty 4, 1d supply, fill #0

## 2021-09-16 LAB — OB RESULTS CONSOLE GC/CHLAMYDIA: Gonorrhea: NEGATIVE

## 2021-09-16 LAB — OB RESULTS CONSOLE ABO/RH: RH Type: POSITIVE

## 2021-09-16 LAB — OB RESULTS CONSOLE HIV ANTIBODY (ROUTINE TESTING): HIV: NONREACTIVE

## 2021-09-16 LAB — OB RESULTS CONSOLE RPR: RPR: NONREACTIVE

## 2021-09-16 LAB — OB RESULTS CONSOLE HEPATITIS B SURFACE ANTIGEN: Hepatitis B Surface Ag: NEGATIVE

## 2021-09-16 LAB — OB RESULTS CONSOLE ANTIBODY SCREEN: Antibody Screen: NEGATIVE

## 2021-09-16 LAB — OB RESULTS CONSOLE RUBELLA ANTIBODY, IGM: Rubella: IMMUNE

## 2021-09-16 LAB — OB RESULTS CONSOLE GBS: GBS: POSITIVE

## 2021-10-01 ENCOUNTER — Other Ambulatory Visit (HOSPITAL_COMMUNITY): Payer: Self-pay

## 2021-10-01 MED ORDER — AZITHROMYCIN 500 MG PO TABS
1000.0000 mg | ORAL_TABLET | Freq: Once | ORAL | 0 refills | Status: AC
  Filled 2021-10-01: qty 2, 1d supply, fill #0

## 2021-10-14 LAB — OB RESULTS CONSOLE GC/CHLAMYDIA: Chlamydia: POSITIVE

## 2021-10-16 ENCOUNTER — Other Ambulatory Visit (HOSPITAL_COMMUNITY): Payer: Self-pay

## 2021-10-16 MED ORDER — AZITHROMYCIN 500 MG PO TABS
1000.0000 mg | ORAL_TABLET | Freq: Once | ORAL | 0 refills | Status: AC
  Filled 2021-10-16: qty 2, 1d supply, fill #0

## 2021-10-20 ENCOUNTER — Other Ambulatory Visit (HOSPITAL_COMMUNITY): Payer: Self-pay

## 2021-10-20 MED ORDER — AMOXICILLIN 250 MG PO CAPS
250.0000 mg | ORAL_CAPSULE | Freq: Three times a day (TID) | ORAL | 0 refills | Status: DC
  Filled 2021-10-20: qty 21, 7d supply, fill #0

## 2021-10-24 ENCOUNTER — Other Ambulatory Visit: Payer: Self-pay

## 2021-10-28 ENCOUNTER — Other Ambulatory Visit: Payer: Self-pay | Admitting: Obstetrics and Gynecology

## 2021-10-28 DIAGNOSIS — Z363 Encounter for antenatal screening for malformations: Secondary | ICD-10-CM

## 2021-11-04 ENCOUNTER — Encounter: Payer: Self-pay | Admitting: *Deleted

## 2021-11-07 ENCOUNTER — Ambulatory Visit: Payer: Medicaid Other | Attending: Obstetrics and Gynecology

## 2021-11-07 ENCOUNTER — Other Ambulatory Visit: Payer: Self-pay | Admitting: Obstetrics and Gynecology

## 2021-11-07 ENCOUNTER — Other Ambulatory Visit: Payer: Self-pay | Admitting: *Deleted

## 2021-11-07 ENCOUNTER — Encounter: Payer: Self-pay | Admitting: *Deleted

## 2021-11-07 ENCOUNTER — Ambulatory Visit (HOSPITAL_BASED_OUTPATIENT_CLINIC_OR_DEPARTMENT_OTHER): Payer: Medicaid Other | Admitting: Obstetrics

## 2021-11-07 ENCOUNTER — Other Ambulatory Visit: Payer: Self-pay

## 2021-11-07 ENCOUNTER — Ambulatory Visit: Payer: Medicaid Other | Admitting: *Deleted

## 2021-11-07 VITALS — BP 107/50 | HR 74

## 2021-11-07 DIAGNOSIS — O365921 Maternal care for other known or suspected poor fetal growth, second trimester, fetus 1: Secondary | ICD-10-CM

## 2021-11-07 DIAGNOSIS — Z363 Encounter for antenatal screening for malformations: Secondary | ICD-10-CM | POA: Insufficient documentation

## 2021-11-07 DIAGNOSIS — O99212 Obesity complicating pregnancy, second trimester: Secondary | ICD-10-CM | POA: Insufficient documentation

## 2021-11-07 DIAGNOSIS — O10912 Unspecified pre-existing hypertension complicating pregnancy, second trimester: Secondary | ICD-10-CM | POA: Diagnosis present

## 2021-11-07 DIAGNOSIS — O365920 Maternal care for other known or suspected poor fetal growth, second trimester, not applicable or unspecified: Secondary | ICD-10-CM

## 2021-11-07 DIAGNOSIS — Z3A20 20 weeks gestation of pregnancy: Secondary | ICD-10-CM | POA: Diagnosis present

## 2021-11-07 DIAGNOSIS — O0730 Failed attempted termination of pregnancy with unspecified complications: Secondary | ICD-10-CM

## 2021-11-07 DIAGNOSIS — Z362 Encounter for other antenatal screening follow-up: Secondary | ICD-10-CM

## 2021-11-07 NOTE — Progress Notes (Signed)
MFM Note  Miranda Hawkins was seen for a detailed fetal anatomy scan due to maternal obesity. The patient reports that she received 112 mg of methotrexate in the first trimester in an attempt to terminate her pregnancy.  She then received vaginal Cytotec.  She then changed her mind regarding termination when the fetus did not pass.   She denies any significant past medical history and denies any problems in her current pregnancy.    She had a cell free DNA test earlier in her pregnancy which indicated a low risk for trisomy 7, 62, and 13. A female fetus is predicted.   She was informed that the overall EFW measures at the 4th percentile for her gestational age.  The fetal biometry measurements are about 8 days behind her dates.  There were no obvious fetal anomalies noted on today's ultrasound exam.  However, the views of the fetal anatomy were limited today due to the fetal position.  The patient was informed that anomalies may be missed due to technical limitations. If the fetus is in a suboptimal position or maternal habitus is increased, visualization of the fetus in the maternal uterus may be impaired.  The increased risk of IUGR and early onset severe preeclampsia requiring an indicated preterm birth, possibly due to placental dysfunction from early exposure to methotrexate was discussed.   The patient was advised that her fetus is already measuring in the smaller end of the spectrum today.    Doppler studies of the umbilical arteries performed today shows a normal S/D ratio.  There were no signs of absent or reversed end-diastolic flow.    Due to her increased risk of preeclampsia, she was advised to start taking a daily baby aspirin (81 mg daily) for preeclampsia prophylaxis.    We will continue to follow her closely throughout her pregnancy to assess the fetal growth.    A follow-up exam was scheduled in 3 weeks to complete the views of the fetal anatomy and to assess the fetal growth.     The patient stated that all of her questions have been answered today.  A total of 30 minutes was spent counseling and coordinating the care for this patient.  Greater than 50% of the time was spent in direct face-to-face contact.

## 2021-11-11 LAB — OB RESULTS CONSOLE GC/CHLAMYDIA: Chlamydia: NEGATIVE

## 2021-12-10 ENCOUNTER — Ambulatory Visit: Payer: Medicaid Other | Attending: Obstetrics

## 2021-12-10 ENCOUNTER — Ambulatory Visit: Payer: Medicaid Other | Admitting: *Deleted

## 2021-12-10 ENCOUNTER — Other Ambulatory Visit: Payer: Self-pay

## 2021-12-10 VITALS — BP 110/60 | HR 78

## 2021-12-10 DIAGNOSIS — O99212 Obesity complicating pregnancy, second trimester: Secondary | ICD-10-CM | POA: Insufficient documentation

## 2021-12-10 DIAGNOSIS — E669 Obesity, unspecified: Secondary | ICD-10-CM | POA: Diagnosis not present

## 2021-12-10 DIAGNOSIS — O365920 Maternal care for other known or suspected poor fetal growth, second trimester, not applicable or unspecified: Secondary | ICD-10-CM | POA: Diagnosis present

## 2021-12-10 DIAGNOSIS — Z3A24 24 weeks gestation of pregnancy: Secondary | ICD-10-CM

## 2021-12-10 DIAGNOSIS — Z362 Encounter for other antenatal screening follow-up: Secondary | ICD-10-CM | POA: Diagnosis present

## 2021-12-11 ENCOUNTER — Other Ambulatory Visit: Payer: Self-pay | Admitting: *Deleted

## 2021-12-11 DIAGNOSIS — Z6838 Body mass index (BMI) 38.0-38.9, adult: Secondary | ICD-10-CM

## 2021-12-11 DIAGNOSIS — O365920 Maternal care for other known or suspected poor fetal growth, second trimester, not applicable or unspecified: Secondary | ICD-10-CM

## 2021-12-11 DIAGNOSIS — O0730 Failed attempted termination of pregnancy with unspecified complications: Secondary | ICD-10-CM

## 2021-12-25 ENCOUNTER — Other Ambulatory Visit: Payer: Self-pay

## 2021-12-25 ENCOUNTER — Ambulatory Visit: Payer: Medicaid Other | Attending: Obstetrics

## 2021-12-25 ENCOUNTER — Ambulatory Visit: Payer: Medicaid Other | Admitting: *Deleted

## 2021-12-25 VITALS — BP 121/59 | HR 79

## 2021-12-25 DIAGNOSIS — Z6838 Body mass index (BMI) 38.0-38.9, adult: Secondary | ICD-10-CM

## 2021-12-25 DIAGNOSIS — F1210 Cannabis abuse, uncomplicated: Secondary | ICD-10-CM | POA: Insufficient documentation

## 2021-12-25 DIAGNOSIS — O99212 Obesity complicating pregnancy, second trimester: Secondary | ICD-10-CM

## 2021-12-25 DIAGNOSIS — O365920 Maternal care for other known or suspected poor fetal growth, second trimester, not applicable or unspecified: Secondary | ICD-10-CM | POA: Diagnosis not present

## 2021-12-25 DIAGNOSIS — O99322 Drug use complicating pregnancy, second trimester: Secondary | ICD-10-CM | POA: Diagnosis not present

## 2021-12-25 DIAGNOSIS — Z3A26 26 weeks gestation of pregnancy: Secondary | ICD-10-CM | POA: Diagnosis not present

## 2021-12-25 DIAGNOSIS — O0730 Failed attempted termination of pregnancy with unspecified complications: Secondary | ICD-10-CM | POA: Insufficient documentation

## 2021-12-25 DIAGNOSIS — E668 Other obesity: Secondary | ICD-10-CM

## 2021-12-31 ENCOUNTER — Ambulatory Visit: Payer: Medicaid Other | Attending: Obstetrics

## 2021-12-31 ENCOUNTER — Ambulatory Visit: Payer: Medicaid Other | Admitting: *Deleted

## 2021-12-31 ENCOUNTER — Ambulatory Visit (HOSPITAL_BASED_OUTPATIENT_CLINIC_OR_DEPARTMENT_OTHER): Payer: Medicaid Other | Admitting: Maternal & Fetal Medicine

## 2021-12-31 ENCOUNTER — Other Ambulatory Visit: Payer: Self-pay

## 2021-12-31 VITALS — BP 107/78 | HR 77

## 2021-12-31 DIAGNOSIS — O09891 Supervision of other high risk pregnancies, first trimester: Secondary | ICD-10-CM | POA: Diagnosis present

## 2021-12-31 DIAGNOSIS — O0730 Failed attempted termination of pregnancy with unspecified complications: Secondary | ICD-10-CM

## 2021-12-31 DIAGNOSIS — Z6838 Body mass index (BMI) 38.0-38.9, adult: Secondary | ICD-10-CM | POA: Diagnosis present

## 2021-12-31 DIAGNOSIS — Z3A27 27 weeks gestation of pregnancy: Secondary | ICD-10-CM | POA: Insufficient documentation

## 2021-12-31 DIAGNOSIS — O09892 Supervision of other high risk pregnancies, second trimester: Secondary | ICD-10-CM | POA: Diagnosis not present

## 2021-12-31 DIAGNOSIS — O365920 Maternal care for other known or suspected poor fetal growth, second trimester, not applicable or unspecified: Secondary | ICD-10-CM

## 2021-12-31 DIAGNOSIS — O99212 Obesity complicating pregnancy, second trimester: Secondary | ICD-10-CM | POA: Insufficient documentation

## 2021-12-31 NOTE — Progress Notes (Signed)
MFM Brief Follow up Note.  Miranda Hawkins is here for a follow up growth at the request of Dr. Bobbye Charleston  [name] pregnancy is complicated by early first trimester methotrexate and early fetal growth restriction.  I discusse with [name] that today the EFW is 1% which meets criteria for severe fetal growth restriction. In addition, we observed mild unilateral left ventriculmegay at 10.3 mm.  There were no additional markers of aneuploidy observed. The UA Dopplers were normal with evidence of AEDF or REDF.  I discussed the significance of severe FGR and mild ventriculmegaly. She has a low risk NIPS. We discussed that such findings may be due to the methotrexate exposure. However, in general mild ventriculomegaly with completely isolated has a 4% risk of neurodevelopmental delay, however, given the methotrexate exposure we cannot be certain that  this effect may not be occur despite having mild ventriculomegaly.  In addition, we reviewed additional related finding of the methrotrexate exposure including cardiac, renal, facial, and CNS abnormalities.  A such I have scheduled [name] to have a pediatric fetal echocardiogram.  In addition, we have initiated weekly testing vian UA Doppler, NST and AFI with repeat growth in 3 weeks.  I lastly, explained that given the early nature of FGR, early preterm delivery risk is increased.  All questions answered.  She was able to express an understanding.  [name], MD   I spent 20 minutes with  >50% in direct face to face consulation.

## 2022-01-01 ENCOUNTER — Other Ambulatory Visit: Payer: Self-pay | Admitting: *Deleted

## 2022-01-01 DIAGNOSIS — O365990 Maternal care for other known or suspected poor fetal growth, unspecified trimester, not applicable or unspecified: Secondary | ICD-10-CM

## 2022-01-01 DIAGNOSIS — O3509X0 Maternal care for (suspected) other central nervous system malformation or damage in fetus, not applicable or unspecified: Secondary | ICD-10-CM

## 2022-01-07 ENCOUNTER — Other Ambulatory Visit: Payer: Self-pay

## 2022-01-07 ENCOUNTER — Ambulatory Visit: Payer: Medicaid Other | Attending: Obstetrics

## 2022-01-07 ENCOUNTER — Ambulatory Visit: Payer: Medicaid Other | Admitting: *Deleted

## 2022-01-07 VITALS — BP 116/52 | HR 73

## 2022-01-07 DIAGNOSIS — Z3A28 28 weeks gestation of pregnancy: Secondary | ICD-10-CM | POA: Diagnosis not present

## 2022-01-07 DIAGNOSIS — O99213 Obesity complicating pregnancy, third trimester: Secondary | ICD-10-CM

## 2022-01-07 DIAGNOSIS — O43123 Velamentous insertion of umbilical cord, third trimester: Secondary | ICD-10-CM

## 2022-01-07 DIAGNOSIS — O365920 Maternal care for other known or suspected poor fetal growth, second trimester, not applicable or unspecified: Secondary | ICD-10-CM

## 2022-01-07 DIAGNOSIS — O99323 Drug use complicating pregnancy, third trimester: Secondary | ICD-10-CM | POA: Diagnosis not present

## 2022-01-07 DIAGNOSIS — O365930 Maternal care for other known or suspected poor fetal growth, third trimester, not applicable or unspecified: Secondary | ICD-10-CM | POA: Insufficient documentation

## 2022-01-07 DIAGNOSIS — Z6838 Body mass index (BMI) 38.0-38.9, adult: Secondary | ICD-10-CM

## 2022-01-07 DIAGNOSIS — O0730 Failed attempted termination of pregnancy with unspecified complications: Secondary | ICD-10-CM | POA: Diagnosis not present

## 2022-01-07 NOTE — Procedures (Signed)
Miranda Hawkins 01/10/1996 [redacted]w[redacted]d  Fetus A Non-Stress Test Interpretation for 01/07/22  Indication: IUGR  Fetal Heart Rate A Mode: External Baseline Rate (A): 130 bpm Variability: Minimal, Moderate Accelerations: 10 x 10 Decelerations: None Multiple birth?: No  Uterine Activity Mode: Palpation, Toco Contraction Frequency (min): none Resting Tone Palpated: Relaxed  Interpretation (Fetal Testing) Nonstress Test Interpretation: Reactive Overall Impression: Reassuring for gestational age Comments: Dr. Grace Bushy reviewed tracing

## 2022-01-09 ENCOUNTER — Other Ambulatory Visit (HOSPITAL_COMMUNITY): Payer: Self-pay

## 2022-01-09 MED ORDER — PREDNISOLONE ACETATE 1 % OP SUSP
OPHTHALMIC | 0 refills | Status: AC
  Filled 2022-01-09: qty 5, 14d supply, fill #0

## 2022-01-09 MED ORDER — KETOROLAC TROMETHAMINE 0.5 % OP SOLN
1.0000 [drp] | Freq: Two times a day (BID) | OPHTHALMIC | 0 refills | Status: DC
  Filled 2022-01-09: qty 5, 14d supply, fill #0

## 2022-01-16 ENCOUNTER — Ambulatory Visit: Payer: Medicaid Other | Admitting: *Deleted

## 2022-01-16 ENCOUNTER — Other Ambulatory Visit: Payer: Self-pay | Admitting: Maternal & Fetal Medicine

## 2022-01-16 ENCOUNTER — Encounter: Payer: Self-pay | Admitting: *Deleted

## 2022-01-16 ENCOUNTER — Ambulatory Visit: Payer: Medicaid Other | Attending: Obstetrics and Gynecology

## 2022-01-16 ENCOUNTER — Other Ambulatory Visit: Payer: Self-pay

## 2022-01-16 VITALS — BP 120/62 | HR 75

## 2022-01-16 DIAGNOSIS — O365930 Maternal care for other known or suspected poor fetal growth, third trimester, not applicable or unspecified: Secondary | ICD-10-CM | POA: Diagnosis not present

## 2022-01-16 DIAGNOSIS — Z3A30 30 weeks gestation of pregnancy: Secondary | ICD-10-CM | POA: Diagnosis not present

## 2022-01-16 DIAGNOSIS — O99213 Obesity complicating pregnancy, third trimester: Secondary | ICD-10-CM

## 2022-01-16 DIAGNOSIS — O365990 Maternal care for other known or suspected poor fetal growth, unspecified trimester, not applicable or unspecified: Secondary | ICD-10-CM

## 2022-01-16 DIAGNOSIS — O3509X0 Maternal care for (suspected) other central nervous system malformation or damage in fetus, not applicable or unspecified: Secondary | ICD-10-CM

## 2022-01-16 DIAGNOSIS — O365931 Maternal care for other known or suspected poor fetal growth, third trimester, fetus 1: Secondary | ICD-10-CM | POA: Insufficient documentation

## 2022-01-16 NOTE — Procedures (Signed)
LATISSHA SMOLINSKI October 18, 1996 [redacted]w[redacted]d  Fetus A Non-Stress Test Interpretation for 01/16/22  Indication: IUGR  Fetal Heart Rate A Mode: External Baseline Rate (A): 135 bpm Variability: Minimal Accelerations: 10 x 10 (one 10 X 10) Decelerations: Variable  Uterine Activity Mode: Toco Contraction Frequency (min): none Resting Tone Palpated: Relaxed  Interpretation (Fetal Testing) Nonstress Test Interpretation: Non-reactive Overall Impression: Non-reassuring Comments: tracing reviewed by Dr. Annamaria Boots; BPP exam added to current ultrasound

## 2022-01-16 NOTE — Procedures (Signed)
Miranda Hawkins 1996-10-10 [redacted]w[redacted]d  Fetus A Non-Stress Test Interpretation for 01/16/22  Indication: IUGR  Fetal Heart Rate A Mode: External Baseline Rate (A): 135 bpm Variability: Minimal Accelerations: 10 x 10 (one 10 X 10) Decelerations: Variable  Uterine Activity Mode: Toco Contraction Frequency (min): none Resting Tone Palpated: Relaxed  Interpretation (Fetal Testing) Nonstress Test Interpretation: Non-reactive Overall Impression: Non-reassuring Comments: tracing reviewed by Dr. Parke Poisson

## 2022-01-19 ENCOUNTER — Other Ambulatory Visit: Payer: Self-pay | Admitting: *Deleted

## 2022-01-19 DIAGNOSIS — O365930 Maternal care for other known or suspected poor fetal growth, third trimester, not applicable or unspecified: Secondary | ICD-10-CM

## 2022-01-20 ENCOUNTER — Ambulatory Visit: Payer: Medicaid Other

## 2022-01-20 ENCOUNTER — Ambulatory Visit: Payer: Medicaid Other | Attending: Obstetrics and Gynecology

## 2022-01-21 ENCOUNTER — Other Ambulatory Visit: Payer: Self-pay | Admitting: *Deleted

## 2022-01-21 DIAGNOSIS — O365931 Maternal care for other known or suspected poor fetal growth, third trimester, fetus 1: Secondary | ICD-10-CM

## 2022-01-26 ENCOUNTER — Other Ambulatory Visit: Payer: Self-pay

## 2022-01-26 ENCOUNTER — Ambulatory Visit: Payer: Medicaid Other | Attending: Obstetrics and Gynecology | Admitting: *Deleted

## 2022-01-26 ENCOUNTER — Ambulatory Visit: Payer: Medicaid Other | Admitting: *Deleted

## 2022-01-26 ENCOUNTER — Ambulatory Visit (HOSPITAL_BASED_OUTPATIENT_CLINIC_OR_DEPARTMENT_OTHER): Payer: Medicaid Other

## 2022-01-26 VITALS — BP 121/57 | HR 78

## 2022-01-26 DIAGNOSIS — O99323 Drug use complicating pregnancy, third trimester: Secondary | ICD-10-CM | POA: Diagnosis not present

## 2022-01-26 DIAGNOSIS — F1290 Cannabis use, unspecified, uncomplicated: Secondary | ICD-10-CM | POA: Insufficient documentation

## 2022-01-26 DIAGNOSIS — O365930 Maternal care for other known or suspected poor fetal growth, third trimester, not applicable or unspecified: Secondary | ICD-10-CM | POA: Insufficient documentation

## 2022-01-26 DIAGNOSIS — Z3A31 31 weeks gestation of pregnancy: Secondary | ICD-10-CM

## 2022-01-26 DIAGNOSIS — O99213 Obesity complicating pregnancy, third trimester: Secondary | ICD-10-CM | POA: Diagnosis not present

## 2022-01-26 DIAGNOSIS — O365990 Maternal care for other known or suspected poor fetal growth, unspecified trimester, not applicable or unspecified: Secondary | ICD-10-CM

## 2022-01-26 DIAGNOSIS — O43123 Velamentous insertion of umbilical cord, third trimester: Secondary | ICD-10-CM | POA: Diagnosis not present

## 2022-01-26 DIAGNOSIS — E669 Obesity, unspecified: Secondary | ICD-10-CM | POA: Insufficient documentation

## 2022-01-26 DIAGNOSIS — O3509X0 Maternal care for (suspected) other central nervous system malformation or damage in fetus, not applicable or unspecified: Secondary | ICD-10-CM | POA: Insufficient documentation

## 2022-01-26 NOTE — Procedures (Signed)
Miranda Hawkins 18-Dec-1995 102w3d  Fetus A Non-Stress Test Interpretation for 01/26/22  Indication: IUGR  Fetal Heart Rate A Mode: External Baseline Rate (A): 140 bpm Variability: Minimal, Moderate Accelerations: 10 x 10 Decelerations: None Multiple birth?: No  Uterine Activity Mode: Palpation, Toco Contraction Frequency (min): none Resting Tone Palpated: Relaxed  Interpretation (Fetal Testing) Nonstress Test Interpretation: Reactive Overall Impression: Reassuring for gestational age Comments: Dr. Parke Poisson reviewed tracing.

## 2022-02-02 ENCOUNTER — Ambulatory Visit: Payer: Medicaid Other | Admitting: *Deleted

## 2022-02-02 ENCOUNTER — Ambulatory Visit: Payer: Medicaid Other | Attending: Obstetrics and Gynecology

## 2022-02-02 ENCOUNTER — Encounter: Payer: Self-pay | Admitting: *Deleted

## 2022-02-02 ENCOUNTER — Other Ambulatory Visit: Payer: Self-pay

## 2022-02-02 VITALS — BP 132/71 | HR 71

## 2022-02-02 DIAGNOSIS — O43123 Velamentous insertion of umbilical cord, third trimester: Secondary | ICD-10-CM

## 2022-02-02 DIAGNOSIS — O365930 Maternal care for other known or suspected poor fetal growth, third trimester, not applicable or unspecified: Secondary | ICD-10-CM

## 2022-02-02 DIAGNOSIS — Z3A32 32 weeks gestation of pregnancy: Secondary | ICD-10-CM

## 2022-02-02 NOTE — Procedures (Signed)
Miranda Hawkins March 13, 1996 [redacted]w[redacted]d  Fetus A Non-Stress Test Interpretation for 02/02/22  Indication: IUGR  Fetal Heart Rate A Mode: External Baseline Rate (A): 130 bpm Variability: Minimal, Moderate Accelerations: None Decelerations: None Multiple birth?: No  Uterine Activity Mode: Palpation, Toco Contraction Frequency (min): none Resting Tone Palpated: Relaxed  Interpretation (Fetal Testing) Nonstress Test Interpretation: Non-reactive Overall Impression: Reassuring for gestational age Comments: Dr. Judeth Cornfield reviewed tracing

## 2022-02-03 ENCOUNTER — Other Ambulatory Visit: Payer: Self-pay | Admitting: *Deleted

## 2022-02-03 DIAGNOSIS — O365930 Maternal care for other known or suspected poor fetal growth, third trimester, not applicable or unspecified: Secondary | ICD-10-CM

## 2022-02-05 ENCOUNTER — Ambulatory Visit: Payer: Medicaid Other

## 2022-02-05 ENCOUNTER — Other Ambulatory Visit: Payer: Medicaid Other

## 2022-02-05 ENCOUNTER — Other Ambulatory Visit: Payer: Self-pay | Admitting: *Deleted

## 2022-02-05 DIAGNOSIS — O365921 Maternal care for other known or suspected poor fetal growth, second trimester, fetus 1: Secondary | ICD-10-CM

## 2022-02-12 ENCOUNTER — Ambulatory Visit: Payer: Medicaid Other | Admitting: *Deleted

## 2022-02-12 ENCOUNTER — Other Ambulatory Visit: Payer: Self-pay

## 2022-02-12 ENCOUNTER — Ambulatory Visit: Payer: Medicaid Other | Attending: Obstetrics and Gynecology

## 2022-02-12 VITALS — BP 110/59 | HR 79

## 2022-02-12 DIAGNOSIS — O365930 Maternal care for other known or suspected poor fetal growth, third trimester, not applicable or unspecified: Secondary | ICD-10-CM | POA: Insufficient documentation

## 2022-02-12 DIAGNOSIS — O43129 Velamentous insertion of umbilical cord, unspecified trimester: Secondary | ICD-10-CM

## 2022-02-12 DIAGNOSIS — O99320 Drug use complicating pregnancy, unspecified trimester: Secondary | ICD-10-CM | POA: Diagnosis not present

## 2022-02-12 DIAGNOSIS — O99213 Obesity complicating pregnancy, third trimester: Secondary | ICD-10-CM

## 2022-02-12 DIAGNOSIS — O43123 Velamentous insertion of umbilical cord, third trimester: Secondary | ICD-10-CM | POA: Diagnosis present

## 2022-02-12 DIAGNOSIS — E669 Obesity, unspecified: Secondary | ICD-10-CM

## 2022-02-12 DIAGNOSIS — Z3A33 33 weeks gestation of pregnancy: Secondary | ICD-10-CM

## 2022-02-12 DIAGNOSIS — F1910 Other psychoactive substance abuse, uncomplicated: Secondary | ICD-10-CM

## 2022-02-12 NOTE — Procedures (Signed)
Miranda Hawkins ?1996/01/01 ?[redacted]w[redacted]d ? ?Fetus A Non-Stress Test Interpretation for 02/12/22 ? ?Indication: IUGR ? ?Fetal Heart Rate A ?Mode: External ?Baseline Rate (A): 130 bpm ?Variability: Moderate ?Accelerations: 15 x 15 ?Decelerations: None ?Multiple birth?: No ? ?Uterine Activity ?Mode: Palpation, Toco ?Contraction Frequency (min): none ?Resting Tone Palpated: Relaxed ? ?Interpretation (Fetal Testing) ?Nonstress Test Interpretation: Reactive ?Overall Impression: Reassuring for gestational age ?Comments: Dr. Judeth Cornfield reviewed tracing ? ? ?

## 2022-02-13 ENCOUNTER — Ambulatory Visit (INDEPENDENT_AMBULATORY_CARE_PROVIDER_SITE_OTHER): Payer: Self-pay | Admitting: Pediatrics

## 2022-02-13 DIAGNOSIS — Z7681 Expectant parent(s) prebirth pediatrician visit: Secondary | ICD-10-CM

## 2022-02-14 NOTE — Progress Notes (Signed)
Prenatal counseling for impending newborn done. Mother agrees with vaccine policy. Induction planned at 37w d/t IUGR. Mother with HSV, taking Valtrex daily ?Z76.81 ? ?

## 2022-02-18 ENCOUNTER — Ambulatory Visit (HOSPITAL_BASED_OUTPATIENT_CLINIC_OR_DEPARTMENT_OTHER): Payer: Medicaid Other | Admitting: *Deleted

## 2022-02-18 ENCOUNTER — Ambulatory Visit: Payer: Medicaid Other | Admitting: *Deleted

## 2022-02-18 ENCOUNTER — Ambulatory Visit: Payer: Medicaid Other | Attending: Obstetrics and Gynecology

## 2022-02-18 ENCOUNTER — Other Ambulatory Visit: Payer: Self-pay

## 2022-02-18 VITALS — BP 121/65 | HR 73

## 2022-02-18 DIAGNOSIS — Z3A34 34 weeks gestation of pregnancy: Secondary | ICD-10-CM

## 2022-02-18 DIAGNOSIS — O365930 Maternal care for other known or suspected poor fetal growth, third trimester, not applicable or unspecified: Secondary | ICD-10-CM | POA: Insufficient documentation

## 2022-02-18 DIAGNOSIS — E669 Obesity, unspecified: Secondary | ICD-10-CM

## 2022-02-18 DIAGNOSIS — O99320 Drug use complicating pregnancy, unspecified trimester: Secondary | ICD-10-CM | POA: Diagnosis not present

## 2022-02-18 DIAGNOSIS — O43129 Velamentous insertion of umbilical cord, unspecified trimester: Secondary | ICD-10-CM

## 2022-02-18 DIAGNOSIS — O365931 Maternal care for other known or suspected poor fetal growth, third trimester, fetus 1: Secondary | ICD-10-CM

## 2022-02-18 DIAGNOSIS — F1910 Other psychoactive substance abuse, uncomplicated: Secondary | ICD-10-CM | POA: Diagnosis not present

## 2022-02-18 DIAGNOSIS — O365921 Maternal care for other known or suspected poor fetal growth, second trimester, fetus 1: Secondary | ICD-10-CM | POA: Diagnosis present

## 2022-02-18 DIAGNOSIS — O99213 Obesity complicating pregnancy, third trimester: Secondary | ICD-10-CM

## 2022-02-18 NOTE — Procedures (Signed)
Miranda Hawkins ?10/14/1996 ?[redacted]w[redacted]d ? ?Fetus A Non-Stress Test Interpretation for 02/18/22 ? ?Indication: IUGR ? ?Fetal Heart Rate A ?Mode: External ?Baseline Rate (A): 130 bpm ?Variability: Moderate ?Accelerations: 15 x 15 ?Decelerations: None ?Multiple birth?: No ? ?Uterine Activity ?Mode: Palpation, Toco ?Contraction Frequency (min): none ?Resting Tone Palpated: Relaxed ? ?Interpretation (Fetal Testing) ?Nonstress Test Interpretation: Reactive ?Overall Impression: Reassuring for gestational age ?Comments: Dr. Grace Bushy reviewed tracing ? ? ?

## 2022-02-23 ENCOUNTER — Encounter (HOSPITAL_COMMUNITY): Payer: Self-pay | Admitting: Obstetrics and Gynecology

## 2022-02-23 ENCOUNTER — Inpatient Hospital Stay (HOSPITAL_COMMUNITY)
Admission: AD | Admit: 2022-02-23 | Discharge: 2022-02-23 | Disposition: A | Discharge status: Home / Self Care | Payer: Medicaid Other | Attending: Obstetrics and Gynecology | Admitting: Obstetrics and Gynecology

## 2022-02-23 ENCOUNTER — Other Ambulatory Visit: Payer: Self-pay

## 2022-02-23 DIAGNOSIS — Z3A35 35 weeks gestation of pregnancy: Secondary | ICD-10-CM | POA: Insufficient documentation

## 2022-02-23 DIAGNOSIS — O10913 Unspecified pre-existing hypertension complicating pregnancy, third trimester: Secondary | ICD-10-CM | POA: Insufficient documentation

## 2022-02-23 DIAGNOSIS — O99213 Obesity complicating pregnancy, third trimester: Secondary | ICD-10-CM | POA: Insufficient documentation

## 2022-02-23 DIAGNOSIS — M25551 Pain in right hip: Secondary | ICD-10-CM | POA: Insufficient documentation

## 2022-02-23 DIAGNOSIS — O368130 Decreased fetal movements, third trimester, not applicable or unspecified: Secondary | ICD-10-CM | POA: Insufficient documentation

## 2022-02-23 DIAGNOSIS — O365990 Maternal care for other known or suspected poor fetal growth, unspecified trimester, not applicable or unspecified: Secondary | ICD-10-CM

## 2022-02-23 NOTE — MAU Note (Signed)
Fetal movement clicker given. 

## 2022-02-23 NOTE — MAU Provider Note (Signed)
?History  ?  ? ?CSN: [id number] ? ?Arrival date and time: 02/23/22 0911 ? ? Event Date/Time  ? First Provider Initiated Contact with Patient 02/23/22 [phone]   ?  ? ?Chief Complaint  ?Patient presents with  ? Hip Pain  ? Decreased Fetal Movement  ? ?HPI ?This is a 26 year old G3, P0 at 35 weeks and 3 days with a pregnancy complicated by fetal growth restriction.  Patient presents with decreased fetal movement since this morning.  She has not felt the baby move at all since she woke up this morning.  As soon as she arrived, she started to feel the baby move. ? ?Additionally, she complains of right hip pain.  She describes the pain as pins she and nonradiating.  This started couple days ago after having sex.  The pain is worse in certain positions such as rotating or external rotation of the right leg.  She has not tried taking anything for the pain. ? ?OB History   ? ? Gravida  ?3  ? Para  ?   ? Term  ?   ? Preterm  ?   ? AB  ?2  ? Living  ?0  ?  ? ? SAB  ?   ? IAB  ?2  ? Ectopic  ?   ? Multiple  ?   ? Live Births  ?   ?   ?  ?  ? ? ?Past Medical History:  ?Diagnosis Date  ? Abdominal pain   ? Fatigue   ? Gastroparesis   ? GERD (gastroesophageal reflux disease)   ? Headache(784.0)   ? Helicobacter pylori (H. pylori)   ? Hoarseness   ? Hypertension   ? high blood pressure readings  ? Nausea vomiting and diarrhea   ? Night sweats   ? Obesity   ? Rectal bleeding   ? Ulcer   ? Wears glasses   ? ? ?Past Surgical History:  ?Procedure Laterality Date  ? ESOPHAGOGASTRODUODENOSCOPY  02/2011  ? LAPAROSCOPIC CHOLECYSTECTOMY  05/2011  ? MOUTH SURGERY  5 years ago  ? TONSILLECTOMY AND ADENOIDECTOMY  2002  ? ? ?Family History  ?Problem Relation Age of Onset  ? Hypertension Mother   ? Hyperlipidemia Mother   ? Alcoholism Father   ? Arthritis Father   ?     paternal grandparents  ? Diabetes Maternal Grandmother   ? Arthritis Maternal Grandmother   ? Heart disease Maternal Grandmother   ? Diabetes Maternal Aunt   ? Alcoholism Unknown   ?      maternal grandparents  ? Hyperlipidemia Unknown   ?     all four grandparents  ? Stroke Unknown   ? Mental illness Unknown   ?     father's side  ? Heart disease Paternal Grandfather   ? Colon cancer Neg Hx   ? ? ?Social History  ? ?Tobacco Use  ? Smoking status: Never  ? Smokeless tobacco: Never  ?Vaping Use  ? Vaping Use: Never used  ?Substance Use Topics  ? Alcohol use: No  ?  Comment: not while preg  ? Drug use: Not Currently  ?  Types: Marijuana  ?  Comment: last used 11/17/21 as of 02/23/2022  ? ? ?Allergies: No Known Allergies ? ?Medications Prior to Admission  ?Medication Sig Dispense Refill Last Dose  ? aspirin 81 MG chewable tablet Chew by mouth daily.   02/22/2022  ? famotidine (PEPCID) 20 MG tablet Take 20 mg by mouth 2 (two)  times daily.   02/22/2022  ? prenatal vitamin w/FE, FA (PRENATAL 1 + 1) 27-1 MG TABS tablet Take 1 tablet by mouth daily at 12 noon.   02/22/2022  ? ? ?Review of Systems ?Physical Exam  ? ?Blood pressure 135/86, pulse 76, temperature 98.3 ?F (36.8 ?C), temperature source Oral, resp. rate 16, last menstrual period 06/20/2021, SpO2 100 %. ? ?Physical Exam ?Vitals reviewed.  ?Constitutional:   ?   Appearance: Normal appearance.  ?Abdominal:  ?   General: Abdomen is flat.  ?   Palpations: Abdomen is soft.  ?Musculoskeletal:     ?   General: Normal range of motion.  ?   Comments: Pain illicited during FABER.   ?Skin: ?   General: Skin is warm and dry.  ?   Capillary Refill: Capillary refill takes less than 2 seconds.  ?Neurological:  ?   General: No focal deficit present.  ?   Mental Status: She is alert.  ?Psychiatric:     ?   Mood and Affect: Mood normal.     ?   Behavior: Behavior normal.     ?   Thought Content: Thought content normal.     ?   Judgment: Judgment normal.  ? ? ?MAU Course  ?Procedures ?NST:  ?Baseline: 130  ?Variability: moderate ?Accelerations: ++  ?Decelerations: none ?Contractions: none ? ?MDM ?15+ movements during NST ? ?Assessment and Plan  ? ?1. [redacted] weeks gestation  of pregnancy   ?2. Decreased fetal movements in third trimester, single or unspecified fetus   ?3. Hip pain, acute, right   ?4. Pregnancy affected by fetal growth restriction   ? ?Patient reassured. As patient having good fetal activity and reactive NST, I do not feel that a BPP is necessary. Has appt for BPP on Wednesday. Patient to return with further DFM. ? ?Tylenol for hip pain. Exercises demonstrated. ? ?Truett Mainland ?02/23/2022, 9:52 AM  ?

## 2022-02-23 NOTE — MAU Note (Signed)
...  Miranda Hawkins is a 26 y.o. at [redacted]w[redacted]d here in MAU reporting: DFM since waking up this morning around 0645. She states she last felt the baby move around 0100 this morning prior to bed. She is also endorsing bilateral hip pain with activity since this past Saturday after having intercourse. Denies VB or LOF. ? ?Pain score: 3/10 hips ? ?FHT: 145 initial external ? ?

## 2022-02-24 ENCOUNTER — Telehealth (HOSPITAL_COMMUNITY): Payer: Self-pay | Admitting: *Deleted

## 2022-02-24 ENCOUNTER — Encounter (HOSPITAL_COMMUNITY): Payer: Self-pay | Admitting: *Deleted

## 2022-02-24 DIAGNOSIS — O43129 Velamentous insertion of umbilical cord, unspecified trimester: Secondary | ICD-10-CM | POA: Diagnosis not present

## 2022-02-24 DIAGNOSIS — O365930 Maternal care for other known or suspected poor fetal growth, third trimester, not applicable or unspecified: Secondary | ICD-10-CM | POA: Diagnosis not present

## 2022-02-24 DIAGNOSIS — F1910 Other psychoactive substance abuse, uncomplicated: Secondary | ICD-10-CM | POA: Diagnosis not present

## 2022-02-24 DIAGNOSIS — O99320 Drug use complicating pregnancy, unspecified trimester: Secondary | ICD-10-CM | POA: Diagnosis not present

## 2022-02-24 NOTE — Telephone Encounter (Signed)
Preadmission screen  

## 2022-02-25 ENCOUNTER — Ambulatory Visit: Payer: Medicaid Other | Admitting: *Deleted

## 2022-02-25 ENCOUNTER — Other Ambulatory Visit: Payer: Self-pay | Admitting: Obstetrics and Gynecology

## 2022-02-25 ENCOUNTER — Other Ambulatory Visit: Payer: Self-pay

## 2022-02-25 ENCOUNTER — Ambulatory Visit: Payer: Medicaid Other | Attending: Obstetrics and Gynecology

## 2022-02-25 VITALS — BP 118/69 | HR 84

## 2022-02-25 DIAGNOSIS — E669 Obesity, unspecified: Secondary | ICD-10-CM

## 2022-02-25 DIAGNOSIS — O99320 Drug use complicating pregnancy, unspecified trimester: Secondary | ICD-10-CM

## 2022-02-25 DIAGNOSIS — O365930 Maternal care for other known or suspected poor fetal growth, third trimester, not applicable or unspecified: Secondary | ICD-10-CM | POA: Diagnosis not present

## 2022-02-25 DIAGNOSIS — O43123 Velamentous insertion of umbilical cord, third trimester: Secondary | ICD-10-CM

## 2022-02-25 DIAGNOSIS — O365921 Maternal care for other known or suspected poor fetal growth, second trimester, fetus 1: Secondary | ICD-10-CM | POA: Diagnosis present

## 2022-02-25 DIAGNOSIS — O99213 Obesity complicating pregnancy, third trimester: Secondary | ICD-10-CM

## 2022-02-25 DIAGNOSIS — Z3A35 35 weeks gestation of pregnancy: Secondary | ICD-10-CM

## 2022-02-25 DIAGNOSIS — F1910 Other psychoactive substance abuse, uncomplicated: Secondary | ICD-10-CM | POA: Diagnosis not present

## 2022-02-25 NOTE — Procedures (Signed)
Miranda Hawkins ?06-25-1996 ?[redacted]w[redacted]d ? ?Fetus A Non-Stress Test Interpretation for 02/25/22 ? ?Indication: IUGR ? ?Fetal Heart Rate A ?Mode: External ?Baseline Rate (A): 135 bpm ?Variability: Moderate ?Accelerations: 15 x 15 ?Decelerations: None ?Multiple birth?: No ? ?Uterine Activity ?Mode: Palpation, Toco ?Contraction Frequency (min): none ?Resting Tone Palpated: Relaxed ? ?Interpretation (Fetal Testing) ?Nonstress Test Interpretation: Reactive ?Overall Impression: Reassuring for gestational age ?Comments: Dr. Donalee Citrin reviewed tracing ? ? ?

## 2022-02-27 ENCOUNTER — Other Ambulatory Visit (HOSPITAL_COMMUNITY): Payer: Self-pay

## 2022-02-27 MED ORDER — VALACYCLOVIR HCL 500 MG PO TABS
500.0000 mg | ORAL_TABLET | Freq: Two times a day (BID) | ORAL | 4 refills | Status: DC
  Filled 2022-02-27: qty 60, 30d supply, fill #0

## 2022-03-04 ENCOUNTER — Other Ambulatory Visit: Payer: Self-pay | Admitting: Obstetrics and Gynecology

## 2022-03-05 ENCOUNTER — Ambulatory Visit: Payer: Medicaid Other

## 2022-03-05 ENCOUNTER — Other Ambulatory Visit: Payer: Medicaid Other

## 2022-03-05 ENCOUNTER — Other Ambulatory Visit: Payer: Self-pay | Admitting: Obstetrics and Gynecology

## 2022-03-05 NOTE — H&P (Signed)
26 y.o. [redacted]w[redacted]d  G3P0020 comes in for induction for chronic IUGR.  Otherwise has good fetal movement and no bleeding. ? ?Past Medical History:  ?Diagnosis Date  ? Abdominal pain   ? Fatigue   ? Gastroparesis   ? GERD (gastroesophageal reflux disease)   ? Headache(784.0)   ? Helicobacter pylori (H. pylori)   ? Hoarseness   ? Hypertension   ? high blood pressure readings  ? Nausea vomiting and diarrhea   ? Night sweats   ? Obesity   ? Rectal bleeding   ? Ulcer   ? Wears glasses   ?  ?Past Surgical History:  ?Procedure Laterality Date  ? ESOPHAGOGASTRODUODENOSCOPY  02/2011  ? LAPAROSCOPIC CHOLECYSTECTOMY  05/2011  ? MOUTH SURGERY  5 years ago  ? TONSILLECTOMY AND ADENOIDECTOMY  2002  ?  ?OB History  ?Gravida Para Term Preterm AB Living  ?3       2 0  ?SAB IAB Ectopic Multiple Live Births  ?  2        ?  ?# Outcome Date GA Lbr Len/2nd Weight Sex Delivery Anes PTL Lv  ?3 Current           ?2 IAB           ?1 IAB           ?  ?Social History  ? ?Socioeconomic History  ? Marital status: Single  ?  Spouse name: Not on file  ? Number of children: 0  ? Years of education: Not on file  ? Highest education level: Not on file  ?Occupational History  ? Occupation: Consulting civil engineer   ?Tobacco Use  ? Smoking status: Never  ? Smokeless tobacco: Never  ?Vaping Use  ? Vaping Use: Never used  ?Substance and Sexual Activity  ? Alcohol use: No  ?  Comment: not while preg  ? Drug use: Not Currently  ?  Types: Marijuana  ?  Comment: last used 11/17/21 as of 02/23/2022  ? Sexual activity: Yes  ?Other Topics Concern  ? Not on file  ?Social History Narrative  ? Daily caffeine   ?   ? 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   ? Home Situation: lives with mom  ?   ? Spiritual Beliefs: none  ?   ? Lifestyle: no regular exercise; diet is fair - poor appete  ?   ?   ?   ?   ? ?Social Determinants of Health  ? ?Financial Resource Strain: Not on file  ?Food Insecurity: Not on file  ?Transportation Needs: Not on file  ?Physical  Activity: Not on file  ?Stress: Not on file  ?Social Connections: Not on file  ?Intimate Partner Violence: Not on file  ? Patient has no known allergies.  ? ? ?Prenatal Transfer Tool  ?Maternal Diabetes: No ?Genetic Screening: Normal ?Maternal Ultrasounds/Referrals: IUGR ?Fetal Ultrasounds or other Referrals:  Referred to Materal Fetal Medicine  ?Maternal Substance Abuse:  No ?Significant Maternal Medications:  Meds include: Other:   valtrex ?Significant Maternal Lab Results: Group B Strep positive- in urine clinda resistent. ? ?Other PNC: Pt received single dose of methotrexate at about 6 weeks; failed to abort, FHTs continued.   Pt elected to continue pregnancy.  All anatomy and testing normal but baby has been chronically IUGR.  MFM rec delivery at 37 weeks.  Dopplers and ANT have all been normal. ? ? ? ?There were no vitals filed for  this visit. ? ?Lungs/Cor:  NAD ?Abdomen:  soft, gravid ?Ex:  no cords, erythema ?SVE:  P ?FHTs:  130s, good STV, NST R; Cat 1 tracing. ?Toco:  q occ ? ? ?A/P   Chronic IUGR, induction ? GBS POS in urine, pcn. ? ?Loney Laurence  ?

## 2022-03-06 ENCOUNTER — Inpatient Hospital Stay (HOSPITAL_COMMUNITY): Payer: Medicaid Other | Admitting: Anesthesiology

## 2022-03-06 ENCOUNTER — Inpatient Hospital Stay (HOSPITAL_COMMUNITY): Payer: Medicaid Other

## 2022-03-06 ENCOUNTER — Encounter (HOSPITAL_COMMUNITY): Payer: Self-pay | Admitting: Obstetrics and Gynecology

## 2022-03-06 ENCOUNTER — Inpatient Hospital Stay (HOSPITAL_COMMUNITY)
Admission: AD | Admit: 2022-03-06 | Discharge: 2022-03-10 | DRG: 788 | Disposition: A | Discharge status: Home / Self Care | Payer: Medicaid Other | Attending: Obstetrics and Gynecology | Admitting: Obstetrics and Gynecology

## 2022-03-06 DIAGNOSIS — O365990 Maternal care for other known or suspected poor fetal growth, unspecified trimester, not applicable or unspecified: Principal | ICD-10-CM | POA: Diagnosis present

## 2022-03-06 DIAGNOSIS — O365930 Maternal care for other known or suspected poor fetal growth, third trimester, not applicable or unspecified: Secondary | ICD-10-CM | POA: Diagnosis present

## 2022-03-06 DIAGNOSIS — O164 Unspecified maternal hypertension, complicating childbirth: Secondary | ICD-10-CM | POA: Diagnosis not present

## 2022-03-06 DIAGNOSIS — O99824 Streptococcus B carrier state complicating childbirth: Secondary | ICD-10-CM | POA: Diagnosis present

## 2022-03-06 DIAGNOSIS — Z3A36 36 weeks gestation of pregnancy: Secondary | ICD-10-CM

## 2022-03-06 DIAGNOSIS — O99214 Obesity complicating childbirth: Secondary | ICD-10-CM | POA: Diagnosis present

## 2022-03-06 DIAGNOSIS — Z3A37 37 weeks gestation of pregnancy: Secondary | ICD-10-CM | POA: Diagnosis not present

## 2022-03-06 LAB — CBC
HCT: 35 % — ABNORMAL LOW (ref 36.0–46.0)
Hemoglobin: 12 g/dL (ref 12.0–15.0)
MCH: 29 pg (ref 26.0–34.0)
MCHC: 34.3 g/dL (ref 30.0–36.0)
MCV: 84.5 fL (ref 80.0–100.0)
Platelets: 301 10*3/uL (ref 150–400)
RBC: 4.14 MIL/uL (ref 3.87–5.11)
RDW: 13.5 % (ref 11.5–15.5)
WBC: 13.9 10*3/uL — ABNORMAL HIGH (ref 4.0–10.5)
nRBC: 0 % (ref 0.0–0.2)

## 2022-03-06 LAB — TYPE AND SCREEN
ABO/RH(D): B POS
Antibody Screen: NEGATIVE

## 2022-03-06 LAB — RPR: RPR Ser Ql: NONREACTIVE

## 2022-03-06 MED ORDER — SODIUM CHLORIDE 0.9 % IV SOLN
5.0000 10*6.[IU] | Freq: Once | INTRAVENOUS | Status: AC
  Administered 2022-03-06: 5 10*6.[IU] via INTRAVENOUS
  Filled 2022-03-06: qty 5

## 2022-03-06 MED ORDER — DIPHENHYDRAMINE HCL 50 MG/ML IJ SOLN: 12.5000 mg | INTRAMUSCULAR | Status: DC | PRN

## 2022-03-06 MED ORDER — MISOPROSTOL 25 MCG QUARTER TABLET
25.0000 ug | ORAL_TABLET | ORAL | Status: DC | PRN
  Administered 2022-03-06 (×2): 25 ug via VAGINAL
  Filled 2022-03-06 (×2): qty 1

## 2022-03-06 MED ORDER — LIDOCAINE HCL (PF) 1 % IJ SOLN
INTRAMUSCULAR | Status: DC | PRN
  Administered 2022-03-06: 5 mL via EPIDURAL
  Administered 2022-03-06: 4 mL via EPIDURAL

## 2022-03-06 MED ORDER — PHENYLEPHRINE 40 MCG/ML (10ML) SYRINGE FOR IV PUSH (FOR BLOOD PRESSURE SUPPORT): 80.0000 ug | PREFILLED_SYRINGE | INTRAVENOUS | Status: DC | PRN

## 2022-03-06 MED ORDER — LIDOCAINE-EPINEPHRINE (PF) 2 %-1:200000 IJ SOLN
INTRAMUSCULAR | Status: AC
  Filled 2022-03-06: qty 20

## 2022-03-06 MED ORDER — ACETAMINOPHEN 325 MG PO TABS
650.0000 mg | ORAL_TABLET | ORAL | Status: DC | PRN
Start: 2022-03-06 — End: 2022-03-07

## 2022-03-06 MED ORDER — EPHEDRINE 5 MG/ML INJ: 10.0000 mg | INTRAVENOUS | Status: DC | PRN

## 2022-03-06 MED ORDER — ONDANSETRON HCL 4 MG/2ML IJ SOLN
4.0000 mg | Freq: Four times a day (QID) | INTRAMUSCULAR | Status: DC | PRN
  Administered 2022-03-07: 4 mg via INTRAVENOUS

## 2022-03-06 MED ORDER — LIDOCAINE HCL (PF) 1 % IJ SOLN: 30.0000 mL | INTRAMUSCULAR | Status: DC | PRN

## 2022-03-06 MED ORDER — BUTORPHANOL TARTRATE 1 MG/ML IJ SOLN
1.0000 mg | INTRAMUSCULAR | Status: DC | PRN
  Administered 2022-03-06 (×2): 1 mg via INTRAVENOUS
  Filled 2022-03-06 (×2): qty 1

## 2022-03-06 MED ORDER — OXYCODONE-ACETAMINOPHEN 5-325 MG PO TABS: 1.0000 | ORAL_TABLET | ORAL | Status: DC | PRN

## 2022-03-06 MED ORDER — OXYCODONE-ACETAMINOPHEN 5-325 MG PO TABS: 2.0000 | ORAL_TABLET | ORAL | Status: DC | PRN

## 2022-03-06 MED ORDER — LACTATED RINGERS IV SOLN: 500.0000 mL | INTRAVENOUS | Status: DC | PRN

## 2022-03-06 MED ORDER — PENICILLIN G POT IN DEXTROSE 60000 UNIT/ML IV SOLN
3.0000 10*6.[IU] | INTRAVENOUS | Status: DC
  Administered 2022-03-06 (×3): 3 10*6.[IU] via INTRAVENOUS
  Filled 2022-03-06 (×4): qty 50

## 2022-03-06 MED ORDER — MORPHINE SULFATE (PF) 0.5 MG/ML IJ SOLN
INTRAMUSCULAR | Status: AC
  Filled 2022-03-06: qty 10

## 2022-03-06 MED ORDER — ONDANSETRON HCL 4 MG/2ML IJ SOLN
INTRAMUSCULAR | Status: AC
  Filled 2022-03-06: qty 2

## 2022-03-06 MED ORDER — LACTATED RINGERS IV SOLN: 500.0000 mL | Freq: Once | INTRAVENOUS | Status: DC

## 2022-03-06 MED ORDER — TERBUTALINE SULFATE 1 MG/ML IJ SOLN: 0.2500 mg | Freq: Once | INTRAMUSCULAR | Status: DC | PRN

## 2022-03-06 MED ORDER — FLEET ENEMA 7-19 GM/118ML RE ENEM: 1.0000 | ENEMA | RECTAL | Status: DC | PRN

## 2022-03-06 MED ORDER — OXYTOCIN-SODIUM CHLORIDE 30-0.9 UT/500ML-% IV SOLN
2.5000 [IU]/h | INTRAVENOUS | Status: DC
  Filled 2022-03-06: qty 500

## 2022-03-06 MED ORDER — OXYTOCIN-SODIUM CHLORIDE 30-0.9 UT/500ML-% IV SOLN
1.0000 m[IU]/min | INTRAVENOUS | Status: DC
  Administered 2022-03-06: 2 m[IU]/min via INTRAVENOUS
  Administered 2022-03-07: 30 [IU] via INTRAVENOUS

## 2022-03-06 MED ORDER — OXYTOCIN BOLUS FROM INFUSION: 333.0000 mL | Freq: Once | INTRAVENOUS | Status: DC

## 2022-03-06 MED ORDER — LACTATED RINGERS IV SOLN: INTRAVENOUS | Status: DC

## 2022-03-06 MED ORDER — SOD CITRATE-CITRIC ACID 500-334 MG/5ML PO SOLN
30.0000 mL | ORAL | Status: DC | PRN
  Administered 2022-03-06: 30 mL via ORAL
  Filled 2022-03-06: qty 30

## 2022-03-06 MED ORDER — FENTANYL-BUPIVACAINE-NACL 0.5-0.125-0.9 MG/250ML-% EP SOLN
12.0000 mL/h | EPIDURAL | Status: DC | PRN
  Administered 2022-03-06: 12 mL/h via EPIDURAL
  Filled 2022-03-06: qty 250

## 2022-03-06 NOTE — Anesthesia Preprocedure Evaluation (Addendum)
Anesthesia Evaluation  ?Patient identified by MRN, date of birth, ID band ?Patient awake ? ? ? ?Reviewed: ?Allergy & Precautions, NPO status , Patient's Chart, lab work & pertinent test results ? ?History of Anesthesia Complications ?Negative for: history of anesthetic complications ? ?Airway ?Mallampati: II ? ? ?Neck ROM: Full ? ? ? Dental ?  ?Pulmonary ?neg pulmonary ROS,  ?  ?Pulmonary exam normal ? ? ? ? ? ? ? Cardiovascular ?hypertension, Normal cardiovascular exam ? ? ?  ?Neuro/Psych ? Headaches, negative psych ROS  ? GI/Hepatic ?GERD  Medicated,(+)  ?  ? substance abuse ? marijuana use,  ?Chronic nausea, gastroparesis ? ?  ?Endo/Other  ?Morbid obesity ? Renal/GU ?negative Renal ROS  ? ?  ?Musculoskeletal ?negative musculoskeletal ROS ?(+)  ? Abdominal ?  ?Peds ? Hematology ? ?(+) Blood dyscrasia, anemia ,   ?Anesthesia Other Findings ? ? Reproductive/Obstetrics ?(+) Pregnancy ? ?  ? ? ? ? ? ? ? ? ? ? ? ? ? ?  ?  ? ? ? ? ? ? ? ? ?Anesthesia Physical ?Anesthesia Plan ? ?ASA: 3 ? ?Anesthesia Plan: Epidural  ? ?Post-op Pain Management:   ? ?Induction:  ? ?PONV Risk Score and Plan: 2 and Treatment may vary due to age or medical condition ? ?Airway Management Planned: Natural Airway ? ?Additional Equipment: None ? ?Intra-op Plan:  ? ?Post-operative Plan:  ? ?Informed Consent: I have reviewed the patients History and Physical, chart, labs and discussed the procedure including the risks, benefits and alternatives for the proposed anesthesia with the patient or authorized representative who has indicated his/her understanding and acceptance.  ? ? ? ? ? ?Plan Discussed with: Anesthesiologist ? ?Anesthesia Plan Comments: (Labs reviewed. Platelets acceptable, patient not taking any blood thinning medications. Per RN, FHR tracing reported to be stable enough for sitting procedure. Risks and benefits discussed with patient, including PDPH, backache, epidural hematoma, failed epidural,  blood pressure changes, allergic reaction, and nerve injury. Patient expressed understanding and wished to proceed. ? ?Patient comfortable, having no pain with contractions. Plan to use epidural for CS.)  ? ? ? ? ? ?Anesthesia Quick Evaluation ? ?

## 2022-03-06 NOTE — Progress Notes (Signed)
CTSP for lates. ? ?Pt now comfortable with epidural. ? ?Vitals:  ? 03/06/22 2050 03/06/22 2130 03/06/22 2200 03/06/22 2230  ?BP: 130/80 (!) 145/79 (!) 95/54 121/75  ?Pulse: (!) 58 (!) 55 (!) 56 (!) 57  ?Resp:   16   ?Temp:   98.3 ?F (36.8 ?C)   ?TempSrc:   Oral   ?SpO2: 100%     ?Weight:      ?Height:      ?  ?FHTs 140s, gSTV, accels, now late decels with every contraction.  Pt has been changed positions and cut pit back but they persist. ?Toco q 5 ?SVE 3/60/-2 ? ? ?A/P With IUGR and non reassuring Cat 3 FHTs remote from delivery, I counseled the patient about proceeding for cesarean.  Pt agrees and was consented for risks and benefits.  ? ?For urgent cesarean. ? ?Loney Laurence ? ?

## 2022-03-06 NOTE — Anesthesia Procedure Notes (Signed)
Epidural ?Patient location during procedure: OB ?Start time: 03/06/2022 8:21 PM ?End time: 03/06/2022 8:25 PM ? ?Staffing ?Anesthesiologist: Beryle Lathe, MD ?Performed: anesthesiologist  ? ?Preanesthetic Checklist ?Completed: patient identified, IV checked, risks and benefits discussed, monitors and equipment checked, pre-op evaluation and timeout performed ? ?Epidural ?Patient position: sitting ?Prep: DuraPrep ?Patient monitoring: continuous pulse ox and blood pressure ?Approach: midline ?Location: L2-L3 ?Injection technique: LOR saline ? ?Needle:  ?Needle type: Tuohy  ?Needle gauge: 17 G ?Needle length: 9 cm ?Needle insertion depth: 8 cm ?Catheter size: 19 Gauge ?Catheter at skin depth: 13 cm ?Test dose: negative and Other (1% lidocaine) ? ?Assessment ?Events: blood not aspirated ? ?Additional Notes ?Patient identified. Risks including, but not limited to, bleeding, infection, nerve damage, paralysis, inadequate analgesia, blood pressure changes, nausea, vomiting, allergic reaction, postpartum back pain, itching, and headache were discussed. Patient expressed understanding and wished to proceed. Sterile prep and drape, including hand hygiene, mask, and sterile gloves were used. The patient was positioned and the spine was prepped. The skin was anesthetized with lidocaine. No paraesthesia or other complication noted. The patient did not experience any signs of intravascular injection such as tinnitus or metallic taste in mouth, nor signs of intrathecal spread such as rapid motor block. Please see nursing notes for vital signs. The patient tolerated the procedure well.  ? ?Leslye Peer, MDReason for block:procedure for pain ? ? ? ?

## 2022-03-06 NOTE — Progress Notes (Signed)
Pt feeling contractions. ? ?Vitals:  ? 03/06/22 0700 03/06/22 3546 03/06/22 1020 03/06/22 1104  ?BP: (!) 104/46 136/73 (!) 143/83 136/70  ?Pulse: 92 68 (!) 58 61  ?Resp:      ?Temp:      ?TempSrc:      ?Weight:      ?Height:      ?  ?FHTs 130s, gSTV, gLTV, currently not reactive but +scalp stim. ?Toco q5 minutes ?[place] bedside showed vertex with no hand or cord in front of head. ?SVE 2/50/-2,AROM clear. ? ?Continue pitocin. ?

## 2022-03-06 NOTE — H&P (Signed)
26 y.o. [redacted]w[redacted]d  G3P0020 comes in for induction for chronic IUGR.  Otherwise has good fetal movement and no bleeding. ? ?Past Medical History:  ?Diagnosis Date  ? Abdominal pain   ? Fatigue   ? Gastroparesis   ? GERD (gastroesophageal reflux disease)   ? Headache(784.0)   ? Helicobacter pylori (H. pylori)   ? Hoarseness   ? Hypertension   ? high blood pressure readings  ? Nausea vomiting and diarrhea   ? Night sweats   ? Obesity   ? Rectal bleeding   ? Ulcer   ? Wears glasses   ?  ?Past Surgical History:  ?Procedure Laterality Date  ? ESOPHAGOGASTRODUODENOSCOPY  02/2011  ? LAPAROSCOPIC CHOLECYSTECTOMY  05/2011  ? MOUTH SURGERY  5 years ago  ? TONSILLECTOMY AND ADENOIDECTOMY  2002  ?  ?OB History  ?Gravida Para Term Preterm AB Living  ?3       2 0  ?SAB IAB Ectopic Multiple Live Births  ?  2        ?  ?# Outcome Date GA Lbr Len/2nd Weight Sex Delivery Anes PTL Lv  ?3 Current           ?2 IAB           ?1 IAB           ?  ?Social History  ? ?Socioeconomic History  ? Marital status: Single  ?  Spouse name: Not on file  ? Number of children: 0  ? Years of education: Not on file  ? Highest education level: Not on file  ?Occupational History  ? Occupation: Consulting civil engineer   ?Tobacco Use  ? Smoking status: Never  ? Smokeless tobacco: Never  ?Vaping Use  ? Vaping Use: Never used  ?Substance and Sexual Activity  ? Alcohol use: No  ?  Comment: not while preg  ? Drug use: Not Currently  ?  Types: Marijuana  ?  Comment: last used 11/17/21 as of 02/23/2022  ? Sexual activity: Yes  ?Other Topics Concern  ? Not on file  ?Social History Narrative  ? Daily caffeine   ?   ? 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   ? Home Situation: lives with mom  ?   ? Spiritual Beliefs: none  ?   ? Lifestyle: no regular exercise; diet is fair - poor appete  ?   ?   ?   ?   ? ?Social Determinants of Health  ? ?Financial Resource Strain: Not on file  ?Food Insecurity: Not on file  ?Transportation Needs: Not on file  ?Physical  Activity: Not on file  ?Stress: Not on file  ?Social Connections: Not on file  ?Intimate Partner Violence: Not on file  ? Patient has no known allergies.  ? ? ?Prenatal Transfer Tool  ?Maternal Diabetes: No ?Genetic Screening: Normal ?Maternal Ultrasounds/Referrals: IUGR ?Fetal Ultrasounds or other Referrals:  Referred to Materal Fetal Medicine  ?Maternal Substance Abuse:  No ?Significant Maternal Medications:  Meds include: Other:   valtrex ?Significant Maternal Lab Results: Group B Strep positive- in urine clinda resistent. ? ?Other PNC: Pt received single dose of methotrexate at about 6 weeks; failed to abort, FHTs continued.   Pt elected to continue pregnancy.  All anatomy and testing normal but baby has been chronically IUGR.  MFM rec delivery at 37 weeks.  Dopplers and ANT have all been normal. ? ? ? ?Vitals:  ? 03/06/22 0500 03/06/22  0543 03/06/22 0600 03/06/22 0700  ?BP: (!) 141/81 132/77 117/70 (!) 104/46  ?Pulse: 77 96 72 92  ?Resp: 18     ?Temp:      ?TempSrc:      ?Weight:      ?Height:      ? ? ?Lungs/Cor:  NAD ?Abdomen:  soft, gravid ?Ex:  no cords, erythema ?SVE:  1/20/-4, VTX at [place] this week. ?SSE: neg for any lesions ?FHTs:  120s, good STV, NST R; Cat 1 tracing. ?Toco:  q occ ? ? ?A/P   IUGR for induction.  Cytotec x2; pitocin start at 0930.  Neg lesions. ? GBS POS- s/p two doses of PCN.  ? ?Loney Laurence  ?

## 2022-03-07 ENCOUNTER — Encounter (HOSPITAL_COMMUNITY): Payer: Self-pay | Admitting: Obstetrics and Gynecology

## 2022-03-07 ENCOUNTER — Encounter (HOSPITAL_COMMUNITY)
Admission: AD | Disposition: A | Discharge status: Home / Self Care | Payer: Self-pay | Source: Home / Self Care | Attending: Obstetrics and Gynecology

## 2022-03-07 DIAGNOSIS — Z3A37 37 weeks gestation of pregnancy: Secondary | ICD-10-CM

## 2022-03-07 DIAGNOSIS — O164 Unspecified maternal hypertension, complicating childbirth: Secondary | ICD-10-CM

## 2022-03-07 LAB — CBC
HCT: 31.7 % — ABNORMAL LOW (ref 36.0–46.0)
Hemoglobin: 10.6 g/dL — ABNORMAL LOW (ref 12.0–15.0)
MCH: 28.5 pg (ref 26.0–34.0)
MCHC: 33.4 g/dL (ref 30.0–36.0)
MCV: 85.2 fL (ref 80.0–100.0)
Platelets: 235 10*3/uL (ref 150–400)
RBC: 3.72 MIL/uL — ABNORMAL LOW (ref 3.87–5.11)
RDW: 13.5 % (ref 11.5–15.5)
WBC: 10.7 10*3/uL — ABNORMAL HIGH (ref 4.0–10.5)
nRBC: 0 % (ref 0.0–0.2)

## 2022-03-07 SURGERY — Surgical Case
Anesthesia: Epidural | Site: Abdomen | Wound class: Clean Contaminated

## 2022-03-07 MED ORDER — DIPHENHYDRAMINE HCL 25 MG PO CAPS: 25.0000 mg | ORAL_CAPSULE | Freq: Four times a day (QID) | ORAL | Status: DC | PRN

## 2022-03-07 MED ORDER — ONDANSETRON HCL 4 MG/2ML IJ SOLN: 4.0000 mg | Freq: Once | INTRAMUSCULAR | Status: DC | PRN

## 2022-03-07 MED ORDER — MEPERIDINE HCL 25 MG/ML IJ SOLN: 6.2500 mg | INTRAMUSCULAR | Status: DC | PRN

## 2022-03-07 MED ORDER — DIPHENHYDRAMINE HCL 25 MG PO CAPS: 25.0000 mg | ORAL_CAPSULE | ORAL | Status: DC | PRN

## 2022-03-07 MED ORDER — DIPHENHYDRAMINE HCL 50 MG/ML IJ SOLN: 12.5000 mg | INTRAMUSCULAR | Status: DC | PRN

## 2022-03-07 MED ORDER — LACTATED RINGERS IV SOLN: INTRAVENOUS | Status: DC

## 2022-03-07 MED ORDER — WITCH HAZEL-GLYCERIN EX PADS: 1.0000 "application " | MEDICATED_PAD | CUTANEOUS | Status: DC | PRN

## 2022-03-07 MED ORDER — CEFAZOLIN SODIUM-DEXTROSE 2-3 GM-%(50ML) IV SOLR
INTRAVENOUS | Status: DC | PRN
Start: 2022-03-07 — End: 2022-03-07
  Administered 2022-03-07: 2 g via INTRAVENOUS

## 2022-03-07 MED ORDER — DIBUCAINE (PERIANAL) 1 % EX OINT
1.0000 | TOPICAL_OINTMENT | CUTANEOUS | Status: DC | PRN
Start: 2022-03-07 — End: 2022-03-10

## 2022-03-07 MED ORDER — STERILE WATER FOR IRRIGATION IR SOLN
Status: DC | PRN
  Administered 2022-03-07: 1

## 2022-03-07 MED ORDER — NALOXONE HCL 0.4 MG/ML IJ SOLN: 0.4000 mg | INTRAMUSCULAR | Status: DC | PRN

## 2022-03-07 MED ORDER — KETOROLAC TROMETHAMINE 30 MG/ML IJ SOLN: 30.0000 mg | Freq: Four times a day (QID) | INTRAMUSCULAR | Status: DC | PRN

## 2022-03-07 MED ORDER — SIMETHICONE 80 MG PO CHEW
80.0000 mg | CHEWABLE_TABLET | Freq: Three times a day (TID) | ORAL | Status: DC
  Administered 2022-03-07 – 2022-03-10 (×11): 80 mg via ORAL
  Filled 2022-03-07 (×11): qty 1

## 2022-03-07 MED ORDER — FLEET ENEMA 7-19 GM/118ML RE ENEM: 1.0000 | ENEMA | Freq: Every day | RECTAL | Status: DC | PRN

## 2022-03-07 MED ORDER — KETOROLAC TROMETHAMINE 30 MG/ML IJ SOLN
INTRAMUSCULAR | Status: AC
  Filled 2022-03-07: qty 1

## 2022-03-07 MED ORDER — FENTANYL CITRATE (PF) 100 MCG/2ML IJ SOLN
INTRAMUSCULAR | Status: AC
  Filled 2022-03-07: qty 2

## 2022-03-07 MED ORDER — SIMETHICONE 80 MG PO CHEW
80.0000 mg | CHEWABLE_TABLET | ORAL | Status: DC | PRN
  Administered 2022-03-10: 80 mg via ORAL
  Filled 2022-03-07: qty 1

## 2022-03-07 MED ORDER — LIDOCAINE-EPINEPHRINE (PF) 2 %-1:200000 IJ SOLN
INTRAMUSCULAR | Status: DC | PRN
  Administered 2022-03-07: 5 mL via EPIDURAL
  Administered 2022-03-07: 10 mL via EPIDURAL
  Administered 2022-03-07: 5 mL via EPIDURAL

## 2022-03-07 MED ORDER — SENNOSIDES-DOCUSATE SODIUM 8.6-50 MG PO TABS
2.0000 | ORAL_TABLET | ORAL | Status: DC
  Administered 2022-03-07 – 2022-03-10 (×4): 2 via ORAL
  Filled 2022-03-07 (×4): qty 2

## 2022-03-07 MED ORDER — ZOLPIDEM TARTRATE 5 MG PO TABS
5.0000 mg | ORAL_TABLET | Freq: Every evening | ORAL | Status: DC | PRN
  Filled 2022-03-07: qty 1

## 2022-03-07 MED ORDER — OXYTOCIN-SODIUM CHLORIDE 30-0.9 UT/500ML-% IV SOLN
2.5000 [IU]/h | INTRAVENOUS | Status: AC
  Filled 2022-03-07: qty 500

## 2022-03-07 MED ORDER — FERROUS SULFATE 325 (65 FE) MG PO TABS
325.0000 mg | ORAL_TABLET | Freq: Two times a day (BID) | ORAL | Status: DC
  Administered 2022-03-07 – 2022-03-10 (×8): 325 mg via ORAL
  Filled 2022-03-07 (×8): qty 1

## 2022-03-07 MED ORDER — LACTATED RINGERS IV SOLN: INTRAVENOUS | Status: DC | PRN

## 2022-03-07 MED ORDER — BISACODYL 10 MG RE SUPP: 10.0000 mg | Freq: Every day | RECTAL | Status: DC | PRN

## 2022-03-07 MED ORDER — OXYCODONE HCL 5 MG/5ML PO SOLN: 5.0000 mg | Freq: Once | ORAL | Status: DC | PRN

## 2022-03-07 MED ORDER — MENTHOL 3 MG MT LOZG: 1.0000 | LOZENGE | OROMUCOSAL | Status: DC | PRN

## 2022-03-07 MED ORDER — ONDANSETRON HCL 4 MG/2ML IJ SOLN
INTRAMUSCULAR | Status: AC
  Filled 2022-03-07: qty 2

## 2022-03-07 MED ORDER — NALOXONE HCL 4 MG/10ML IJ SOLN
1.0000 ug/kg/h | INTRAVENOUS | Status: DC | PRN
  Filled 2022-03-07: qty 5

## 2022-03-07 MED ORDER — METHYLERGONOVINE MALEATE 0.2 MG/ML IJ SOLN: 0.2000 mg | INTRAMUSCULAR | Status: DC | PRN

## 2022-03-07 MED ORDER — TETANUS-DIPHTH-ACELL PERTUSSIS 5-2.5-18.5 LF-MCG/0.5 IM SUSY: 0.5000 mL | PREFILLED_SYRINGE | Freq: Once | INTRAMUSCULAR | Status: DC

## 2022-03-07 MED ORDER — OXYCODONE HCL 5 MG PO TABS: 5.0000 mg | ORAL_TABLET | Freq: Once | ORAL | Status: DC | PRN

## 2022-03-07 MED ORDER — FENTANYL CITRATE (PF) 100 MCG/2ML IJ SOLN: 25.0000 ug | INTRAMUSCULAR | Status: DC | PRN

## 2022-03-07 MED ORDER — IBUPROFEN 800 MG PO TABS
800.0000 mg | ORAL_TABLET | Freq: Three times a day (TID) | ORAL | Status: AC
Start: 2022-03-07 — End: 2022-03-09
  Administered 2022-03-07 – 2022-03-09 (×9): 800 mg via ORAL
  Filled 2022-03-07 (×9): qty 1

## 2022-03-07 MED ORDER — PRENATAL MULTIVITAMIN CH
1.0000 | ORAL_TABLET | Freq: Every day | ORAL | Status: DC
  Administered 2022-03-07 – 2022-03-10 (×4): 1 via ORAL
  Filled 2022-03-07 (×4): qty 1

## 2022-03-07 MED ORDER — CEFAZOLIN IN SODIUM CHLORIDE 3-0.9 GM/100ML-% IV SOLN
INTRAVENOUS | Status: AC
  Filled 2022-03-07: qty 100

## 2022-03-07 MED ORDER — MORPHINE SULFATE (PF) 0.5 MG/ML IJ SOLN
INTRAMUSCULAR | Status: DC | PRN
  Administered 2022-03-07: 3 mg via EPIDURAL

## 2022-03-07 MED ORDER — KETOROLAC TROMETHAMINE 30 MG/ML IJ SOLN
30.0000 mg | Freq: Four times a day (QID) | INTRAMUSCULAR | Status: DC | PRN
  Administered 2022-03-07: 30 mg via INTRAVENOUS

## 2022-03-07 MED ORDER — COCONUT OIL OIL
1.0000 | TOPICAL_OIL | Status: DC | PRN
Start: 2022-03-07 — End: 2022-03-10
  Administered 2022-03-09: 1 via TOPICAL

## 2022-03-07 MED ORDER — OXYCODONE-ACETAMINOPHEN 5-325 MG PO TABS
1.0000 | ORAL_TABLET | ORAL | Status: DC | PRN
  Administered 2022-03-07: 1 via ORAL
  Administered 2022-03-08 (×2): 2 via ORAL
  Administered 2022-03-08: 1 via ORAL
  Administered 2022-03-08 – 2022-03-10 (×7): 2 via ORAL
  Filled 2022-03-07 (×2): qty 1
  Filled 2022-03-07 (×9): qty 2

## 2022-03-07 MED ORDER — LIDOCAINE-EPINEPHRINE (PF) 2 %-1:200000 IJ SOLN
INTRAMUSCULAR | Status: AC
  Filled 2022-03-07: qty 20

## 2022-03-07 MED ORDER — ONDANSETRON HCL 4 MG/2ML IJ SOLN: 4.0000 mg | Freq: Three times a day (TID) | INTRAMUSCULAR | Status: DC | PRN

## 2022-03-07 MED ORDER — FENTANYL CITRATE (PF) 100 MCG/2ML IJ SOLN
INTRAMUSCULAR | Status: DC | PRN
Start: 2022-03-07 — End: 2022-03-07
  Administered 2022-03-07: 100 ug via EPIDURAL

## 2022-03-07 MED ORDER — SCOPOLAMINE 1 MG/3DAYS TD PT72
1.0000 | MEDICATED_PATCH | Freq: Once | TRANSDERMAL | Status: AC
  Administered 2022-03-07: 1.5 mg via TRANSDERMAL

## 2022-03-07 MED ORDER — METHYLERGONOVINE MALEATE 0.2 MG PO TABS: 0.2000 mg | ORAL_TABLET | ORAL | Status: DC | PRN

## 2022-03-07 MED ORDER — SODIUM CHLORIDE 0.9% FLUSH: 3.0000 mL | INTRAVENOUS | Status: DC | PRN

## 2022-03-07 MED ORDER — SCOPOLAMINE 1 MG/3DAYS TD PT72
MEDICATED_PATCH | TRANSDERMAL | Status: AC
  Filled 2022-03-07: qty 1

## 2022-03-07 MED ORDER — MEASLES, MUMPS & RUBELLA VAC IJ SOLR: 0.5000 mL | Freq: Once | INTRAMUSCULAR | Status: DC

## 2022-03-07 SURGICAL SUPPLY — 33 items
APL SKNCLS STERI-STRIP NONHPOA (GAUZE/BANDAGES/DRESSINGS) ×1
BENZOIN TINCTURE PRP APPL 2/3 (GAUZE/BANDAGES/DRESSINGS) ×3 IMPLANT
CHLORAPREP W/TINT 26ML (MISCELLANEOUS) ×3 IMPLANT
CLAMP CORD UMBIL (MISCELLANEOUS) IMPLANT
CLOTH BEACON ORANGE TIMEOUT ST (SAFETY) ×3 IMPLANT
CLSR STERI-STRIP ANTIMIC 1/2X4 (GAUZE/BANDAGES/DRESSINGS) ×1 IMPLANT
DRSG OPSITE POSTOP 4X10 (GAUZE/BANDAGES/DRESSINGS) ×3 IMPLANT
ELECT REM PT RETURN 9FT ADLT (ELECTROSURGICAL)
ELECTRODE REM PT RTRN 9FT ADLT (ELECTROSURGICAL) ×2 IMPLANT
EXTRACTOR VACUUM BELL STYLE (SUCTIONS) IMPLANT
GLOVE BIO SURGEON STRL SZ7 (GLOVE) ×3 IMPLANT
GLOVE BIOGEL PI IND STRL 7.0 (GLOVE) ×2 IMPLANT
GLOVE BIOGEL PI INDICATOR 7.0 (GLOVE) ×1
GOWN STRL REUS W/TWL LRG LVL3 (GOWN DISPOSABLE) ×6 IMPLANT
KIT ABG SYR 3ML LUER SLIP (SYRINGE) IMPLANT
NDL HYPO 25X5/8 SAFETYGLIDE (NEEDLE) IMPLANT
NEEDLE HYPO 25X5/8 SAFETYGLIDE (NEEDLE) IMPLANT
NS IRRIG 1000ML POUR BTL (IV SOLUTION) ×3 IMPLANT
PACK C SECTION WH (CUSTOM PROCEDURE TRAY) ×3 IMPLANT
PAD OB MATERNITY 4.3X12.25 (PERSONAL CARE ITEMS) ×3 IMPLANT
RTRCTR C-SECT PINK 25CM LRG (MISCELLANEOUS) ×3 IMPLANT
STRIP CLOSURE SKIN 1/2X4 (GAUZE/BANDAGES/DRESSINGS) ×3 IMPLANT
SUT MNCRL 0 VIOLET CTX 36 (SUTURE) ×4 IMPLANT
SUT MONOCRYL 0 CTX 36 (SUTURE) ×4
SUT PLAIN 2 0 XLH (SUTURE) IMPLANT
SUT VIC AB 0 CT1 27 (SUTURE) ×4
SUT VIC AB 0 CT1 27XBRD ANBCTR (SUTURE) ×4 IMPLANT
SUT VIC AB 2-0 CT1 27 (SUTURE) ×2
SUT VIC AB 2-0 CT1 TAPERPNT 27 (SUTURE) ×2 IMPLANT
SUT VIC AB 4-0 KS 27 (SUTURE) ×3 IMPLANT
TOWEL OR 17X24 6PK STRL BLUE (TOWEL DISPOSABLE) ×3 IMPLANT
TRAY FOLEY W/BAG SLVR 14FR LF (SET/KITS/TRAYS/PACK) ×3 IMPLANT
WATER STERILE IRR 1000ML POUR (IV SOLUTION) ×3 IMPLANT

## 2022-03-07 NOTE — Op Note (Signed)
1:22 AM ? ?PATIENT:  Miranda Hawkins  26 y.o. female ? ?PRE-OPERATIVE DIAGNOSIS:  Primary C/S for nonreassuring fetal heart tracing ? ?POST-OPERATIVE DIAGNOSIS:  Primary C/S for nonreassuring fetal heart tracing ? ?PROCEDURE:  Procedure(s): ?CESAREAN SECTION (N/A) ? ?SURGEON:  Surgeon(s) and Role: ?   Carrington Clamp, MD - Primary ?   Lavina Hamman, MD - Assisting ? ?ANESTHESIA:   epidural ? ?EBL: 304 cc  ? ?SPECIMEN:  Source of Specimen:  placenta ? ?DISPOSITION OF SPECIMEN:  PATHOLOGY ? ?COUNTS:  YES ? ?TOURNIQUET:  * No tourniquets in log * ? ?DICTATION: .Note written in EPIC ? ?PLAN OF CARE: Admit to inpatient  ? ?PATIENT DISPOSITION:  PACU - hemodynamically stable. ?  ?Delay start of Pharmacological VTE agent (>24hrs) due to surgical blood loss or risk of bleeding: not applicable ? ?Complications:  none ?Medications:  Ancef, Pitocin ?Findings:  Baby female, Apgars P but baby active on birth, weight 4#5.   Normal tubes, ovaries and uterus seen.  Baby was taken to NICU for low weight. ? ?Technique: ? ?After adequate epidural anesthesia was achieved, the patient was prepped and draped in usual sterile fashion.  A foley catheter was used to drain the bladder.  A pfannanstiel incision was made with the scalpel and carried down to the fascia with the bovie cautery. The fascia was incised in the midline with the scalpel and carried in a transverse curvilinear manner bilaterally.  The fascia was reflected superiorly and inferiorly off the rectus muscles and the muscles split in the midline.  A bowel free portion of the peritoneum was entered bluntly and then extended in a superior and inferior manner with good visualization of the bowel and bladder.  The Alexis instrument was then placed and the vesico-uterine fascia tented up and incised in a transverse curvilinear manner.  A 2 cm transverse incision was made in the upper portion of the lower uterine segment until the amnion was exposed.   The incision was  extended transversely in a blunt manner.  Clear fluid was noted and the baby delivered in the vertex presentation without complication.  The baby was bulb suctioned and the cord was clamped and cut after one minute of cord blood into baby.  The baby was then handed to awaiting Neonatology.  The placenta was then delivered manually and the uterus cleared of all debris.  The uterine incision was then closed with a running lock stitch of 0 monocryl.  An imbricating layer of 0 monocryl was closed as well. Excellent hemostasis of the uterine incision was achieved and the abdomen was cleared with irrigation.  The peritoneum was closed with a running stitch of 2-0 vicryl.  This incorporated the rectus muscles as a separate layer.  The fascia was then closed with a running stitch of 0 vicryl.  The subcutaneous layer was closed with interrupted  stitches of 2-0 plain gut.  The skin was closed with 4-0 vicryl on a Keith needle and steri-strips.  The patient tolerated the procedure well and was returned to the recovery room in stable condition. ? ?All counts were correct times three. ? ?Miranda Hawkins  ?

## 2022-03-07 NOTE — Anesthesia Postprocedure Evaluation (Signed)
Anesthesia Post Note ? ?Patient: Miranda Hawkins ? ?Procedure(s) Performed: CESAREAN SECTION (Abdomen) ? ?  ? ?Patient location during evaluation: PACU ?Anesthesia Type: Epidural ?Level of consciousness: awake and alert ?Pain management: pain level controlled ?Vital Signs Assessment: post-procedure vital signs reviewed and stable ?Respiratory status: spontaneous breathing, respiratory function stable and nonlabored ventilation ?Cardiovascular status: blood pressure returned to baseline ?Postop Assessment: epidural receding and no apparent nausea or vomiting ?Anesthetic complications: no ? ? ?No notable events documented. ? ?Last Vitals:  ?Vitals:  ? 03/07/22 0319 03/07/22 0330  ?BP: 134/71 133/77  ?Pulse: (!) 58 (!) 55  ?Resp: 14 16  ?Temp:  36.6 ?C  ?SpO2: 100% 100%  ?  ?Last Pain:  ?Vitals:  ? 03/07/22 0330  ?TempSrc: Oral  ?PainSc: 0-No pain  ? ?Pain Goal:   ? ?LLE Motor Response: Purposeful movement (03/07/22 0315) ?LLE Sensation: Tingling (03/07/22 0315) ?RLE Motor Response: Purposeful movement (03/07/22 0315) ?RLE Sensation: Tingling (03/07/22 0315) ?L Sensory Level: L3-Anterior knee, lower leg (03/07/22 0315) ?R Sensory Level: L3-Anterior knee, lower leg (03/07/22 0315) ?Epidural/Spinal Function Cutaneous sensation: Able to Wiggle Toes (03/07/22 0330), Patient able to flex knees: Yes (03/07/22 0330), Patient able to lift hips off bed: Yes (03/07/22 0330), Back pain beyond tenderness at insertion site: No (03/07/22 0330), Progressively worsening motor and/or sensory loss: No (03/07/22 0330), Bowel and/or bladder incontinence post epidural: No (03/07/22 0330) ? ?Beryle Lathe ? ? ? ? ?

## 2022-03-07 NOTE — Lactation Note (Signed)
This note was copied from a baby's chart. ? ?NICU Lactation Consultation Note ? ?Patient Name: Miranda Hawkins ?Today's Date: 03/07/2022 ?Age:26 years ? ?Subjective ?Reason for consult: Initial assessment; 1st time breastfeeding; NICU baby; Infant < 6lbs; Weekly NICU follow-up; Early term 37-38.6wks ? ?Mother and I discussed breastmilk volume norms and stomach size of infant. Mom's pump schedule is on Ezra's feeding schedule.  ? ?Objective ?Infant data: ?Mother's Current Feeding Choice: Breast Milk and Formula (donor milk) ?First pump session at 12:00pm was 85ml. Went over the importance of breast stimulation in the first two weeks and while in the NICU since mom does want to provide her baby with her breastmilk.  ? ?Infant feeding assessment ?Scale for Readiness: 4 ? ?  ?Maternal data: ?[id number]  ?C-Section, Low Transverse ?Current breast feeding challenges:: No ? ?Does the patient have breastfeeding experience prior to this delivery?: No ? ? ?WIC Program: Yes ?[place] Referral Sent?: Yes ?Pump: DEBP, Personal, Hands Free ? ?Assessment ?Maternal: ?Normal physiological breast changes. No barriers at this time for lactation.  ? ?Intervention/Plan ?Interventions: "The NICU and Your Baby" book; [facility] brochure; Education; DEBP; Hand express; Breast massage; Coconut oil ? ? ?Plan: ?Pump 8 or more times in a 24 hour period for breast stimulation ? ?Consult Status: Follow-up ? ?NICU Follow-up type: Maternal D/C visit; Verify absence of engorgement; Verify onset of copious milk; Weekly NICU follow up; New admission follow up; Assist with IDF-2 (Mother does not need to pre-pump before breastfeeding) ? ? ? ?[place] ?03/07/2022, 12:43 PM ?

## 2022-03-07 NOTE — Progress Notes (Signed)
Subjective: ?Postpartum Day 0: Cesarean Delivery ?Patient reports pain controlled, no nausea or vomiting. Foley in situ  ? ?Objective: ?Vital signs in last 24 hours: ?Temp:  [97.3 ?F (36.3 ?C)-98.3 ?F (36.8 ?C)] 98.2 ?F (36.8 ?C) (04/01 1008) ?Pulse Rate:  [52-73] 59 (04/01 1008) ?Resp:  [12-23] 18 (04/01 1008) ?BP: (94-145)/(52-85) 128/68 (04/01 1008) ?SpO2:  [95 %-100 %] 100 % (04/01 0630) ? ?Physical Exam:  ?General: alert, cooperative, and appears stated age ?Lochia: appropriate ?Uterine Fundus: firm ?Incision: compression dressing C/D/I ?DVT Evaluation: No evidence of DVT seen on physical exam. ? ?Recent Labs  ?  03/06/22 ?0101 03/07/22 ?8295  ?HGB 12.0 10.6*  ?HCT 35.0* 31.7*  ? ? ?Assessment/Plan: ?Status post Cesarean section. Doing well postoperatively.  ?Continue current care. ?Desires Circ, however may need to be done by Urology.  ? ?Waynard Reeds ?03/07/2022, 12:28 PM ? ? ?

## 2022-03-07 NOTE — Transfer of Care (Signed)
Immediate Anesthesia Transfer of Care Note ? ?Patient: [name] ? ?Procedure(s) Performed: CESAREAN SECTION (Abdomen) ? ?Patient Location: PACU ? ?Anesthesia Type:Epidural ? ?Level of Consciousness: awake ? ?Airway & Oxygen Therapy: Patient Spontanous Breathing ? ?Post-op Assessment: Report given to RN and Post -op Vital signs reviewed and stable ? ?Post vital signs: Reviewed and stable ? ?Last Vitals:  ?Vitals Value Taken Time  ?BP 118/59 03/07/22 0145  ?Temp    ?Pulse 61 03/07/22 0146  ?Resp 14 03/07/22 0146  ?SpO2 100 % 03/07/22 0146  ?Vitals shown include unvalidated device data. ? ?Last Pain:  ?Vitals:  ? 03/06/22 2200  ?TempSrc: Oral  ?PainSc: 0-No pain  ?   ? ?  ? ?Complications: No notable events documented. ?

## 2022-03-07 NOTE — Brief Op Note (Signed)
03/06/2022 - 03/07/2022 ? ?1:22 AM ? ?PATIENT:  Miranda Hawkins  26 y.o. female ? ?PRE-OPERATIVE DIAGNOSIS:  Primary C/S for nonreassuring fetal heart tracing ? ?POST-OPERATIVE DIAGNOSIS:  Primary C/S for nonreassuring fetal heart tracing ? ?PROCEDURE:  Procedure(s): ?CESAREAN SECTION (N/A) ? ?SURGEON:  Surgeon(s) and Role: ?   Carrington Clamp, MD - Primary ?   Lavina Hamman, MD - Assisting ? ?ANESTHESIA:   epidural ? ?EBL: 304 cc  ? ?SPECIMEN:  Source of Specimen:  placenta ? ?DISPOSITION OF SPECIMEN:  PATHOLOGY ? ?COUNTS:  YES ? ?TOURNIQUET:  * No tourniquets in log * ? ?DICTATION: .Note written in EPIC ? ?PLAN OF CARE: Admit to inpatient  ? ?PATIENT DISPOSITION:  PACU - hemodynamically stable. ?  ?Delay start of Pharmacological VTE agent (>24hrs) due to surgical blood loss or risk of bleeding: not applicable ? ?

## 2022-03-08 NOTE — Plan of Care (Signed)

## 2022-03-08 NOTE — Lactation Note (Signed)
This note was copied from a baby's chart. ?Lactation Consultation Note ? ?Patient Name: Miranda Hawkins ?Today's Date: 03/08/2022 ?Reason for consult: Follow-up assessment;Primapara;1st time breastfeeding;NICU baby;Infant < 6lbs;Other (Comment);Early term 81-38.6wks (IUGR) ?Age:28 hours ? ?Visited with mom of 18 hours old ETI NICU female, she voiced that she has taken a break from pumping because she hasn't been feeling well due to her C-Section. Showed empathy to mom regarding current status and also explained to her how the lack of nipple stimulation might play a role delaying her milk coming in. Explained the importance of consistent pumping and asked her what her goals were. ? ?She voiced she'd like to provide her breastmilk in a bottle (pump and bottle only) but that if breastfeeding doesn't work out she also has different types of formula at home. Reviewed pumping schedule, lactogenesis II and feeding cues.  ? ?Feeding ?Mother's Current Feeding Choice: Breast Milk and Donor Milk ?Nipple Type: Nfant Extra Slow Flow (gold) ? ?Lactation Tools Discussed/Used ?Tools: Pump ?Breast pump type: Double-Electric Breast Pump ?Pump Education: Setup, frequency, and cleaning;Milk Storage ?Reason for Pumping: ETI in NICU ?Pumping frequency: hasn't pumped since yesterday (last time she saw [place]) ?Pumped volume: 0 mL ? ?Interventions ?Interventions: Breast feeding basics reviewed;DEBP;Education ? ?Plan of care ?Encouraged mom to pump at her own pace, she understands that that closer she gets to the 8 pumping sessions/24 hours the more beneficial it will be ?She'll continue working on bottle feedings, baby still on donor milk ? ?FOB present. All questions and concerns answered, parents to call NICU LC PRN. ? ?Discharge ?Pump: DEBP;Personal (Hands free pump) ? ?Consult Status ?Consult Status: Follow-up ?Date: 03/08/22 ?Follow-up type: In-patient ? ? ?Gregory Barrick S Morgann Woodburn ?03/08/2022, 11:26 AM ? ? ? ?

## 2022-03-08 NOTE — Progress Notes (Signed)
Subjective: ?Postpartum Day 1: Cesarean Delivery ?Patient reports pain controlled, tolerating po. Ambulating minimally ? ?Objective: ?Vital signs in last 24 hours: ?Temp:  [97.8 ?F (36.6 ?C)-98.4 ?F (36.9 ?C)] 97.8 ?F (36.6 ?C) (04/02 0547) ?Pulse Rate:  [59-70] 70 (04/02 0547) ?Resp:  [18-20] 18 (04/02 0547) ?BP: (104-133)/(55-77) 104/55 (04/02 0547) ?SpO2:  [100 %] 100 % (04/02 0547) ? ?Physical Exam:  ?General: alert, cooperative, and appears stated age ?Lochia: appropriate ?Uterine Fundus: firm ?Incision: pressure dressing C/D/I ?DVT Evaluation: No evidence of DVT seen on physical exam. ? ?Recent Labs  ?  03/06/22 ?0101 03/07/22 ?1610  ?HGB 12.0 10.6*  ?HCT 35.0* 31.7*  ? ? ?Assessment/Plan: ?Status post Cesarean section. Doing well postoperatively.  ?Continue current care. ?Encouraged pt to ambulate  ? ?Miranda Hawkins ?03/08/2022, 11:01 AM ? ? ?

## 2022-03-09 ENCOUNTER — Other Ambulatory Visit (HOSPITAL_COMMUNITY): Payer: Self-pay

## 2022-03-09 ENCOUNTER — Inpatient Hospital Stay (HOSPITAL_COMMUNITY): Payer: Medicaid Other

## 2022-03-09 MED ORDER — OXYCODONE HCL 5 MG PO TABS
10.0000 mg | ORAL_TABLET | Freq: Once | ORAL | Status: AC
  Administered 2022-03-09: 10 mg via ORAL
  Filled 2022-03-09: qty 2

## 2022-03-09 NOTE — Progress Notes (Signed)
Patient is eating, voiding, no flatus.  Pain control is good. But patient has been reluctant to be out of bed and moving about d/t increased pain. ? ?Vitals:  ? 03/08/22 0547 03/08/22 1358 03/08/22 2032 03/09/22 0517  ?BP: (!) 104/55 (!) 117/59 107/67 102/72  ?Pulse: 70 87 70 62  ?Resp: 18 19 20 18   ?Temp: 97.8 ?F (36.6 ?C) 98.2 ?F (36.8 ?C) 98 ?F (36.7 ?C) 98 ?F (36.7 ?C)  ?TempSrc: Oral Oral Oral Oral  ?SpO2: 100% 100% 97% 98%  ?Weight:      ?Height:      ? ? ?Fundus firm ?Inc: c/d/I ?Ext: no calf tenderness ? ?Lab Results  ?Component Value Date  ? WBC 10.7 (H) 03/07/2022  ? HGB 10.6 (L) 03/07/2022  ? HCT 31.7 (L) 03/07/2022  ? MCV 85.2 03/07/2022  ? PLT 235 03/07/2022  ? ? ?--/--/B POS (03/31 0050) ? ?A/P Post op day 2 (long) for C/S for NRFHT after IOL for IUGR. ?Encourage OOB and ambulation.  No flatus yet, discussed simethicone/colace for constipation/ gas pain. ?Baby to be circ'd outpt by pediatric urology. ?Patient not desiring d/c. ? Routine care.  ? ?05-11-1991 ?  ?

## 2022-03-09 NOTE — Lactation Note (Signed)
This note was copied from a baby's chart. ?Lactation Consultation Note ? ?Patient Name: Miranda Hawkins ?Today's Date: 03/09/2022 ?Reason for consult: Primapara;Early term 37-38.6wks;1st time breastfeeding;Follow-up assessment;Infant < 6lbs;NICU baby ?Age:28 hours ? ?Visited mother of 60 hr early term infant, "Miranda Hawkins", in couplet care. Patient initially to be discharging today, however, will stay for ongoing pain management and likely discharge tomorrow. Mother reports that she was not feeling well, thus, had not been pumping. She conveyed that she is feeling better today and was planning to pump after finishing phone calls. Discussed importance of frequent breast stimulation if the goal was to pump and bottle feed baby. Explained that increasing frequency to 4x/day is better than no pumping at all with the goal of 8x/24hrs if plan is to exclusively provide breast milk. Mother denied any s/s of engorgement at this time. Education provided regarding management of engorgement, nipple care, hand expression, STS, and differences between hands-free/DEBP. Encouraged use of DEBP during first 2 weeks for establishment of milk supply.  ? ?Maternal Data ?Has patient been taught Hand Expression?: Yes (Reviewed HE. Patient declined HE attempt at this time.) ?Does the patient have breastfeeding experience prior to this delivery?: No ? ?Feeding ?Mother's Current Feeding Choice: Breast Milk and Donor Milk ?Nipple Type: Nfant Extra Slow Flow (gold) ? ?Lactation Tools Discussed/Used ?Pump Education: Setup, frequency, and cleaning (Differences between handsfree and DEBP) ?Pumping frequency: Has not pumped in the past 24hrs ? ?Interventions ?Interventions: Breast feeding basics reviewed;Expressed milk;Coconut oil;DEBP;Education ? ?Plan of Care: ?Mother to start pumping to promote milk supply. ?Coconut oil to be applied prior to pumping session for comfort.  ?FOB and MOB to continue working on bottle feeding infant.  ? ?FOB present in  room for consult. All questions and concerns were addressed. Parents to call NICU LC PRN.  ? ?Discharge ?Discharge Education: Engorgement and breast care ?Pump: DEBP;Personal (Medline DEBP, and Handsfree personal pump) ? ?Consult Status ?Consult Status: Follow-up ?Date: 03/09/22 ?Follow-up type: In-patient ? ? ? ?Virgil Slinger-Pervall, Lactation Student ?03/09/2022, 1:41 PM ? ? ? ?

## 2022-03-10 ENCOUNTER — Other Ambulatory Visit (HOSPITAL_COMMUNITY): Payer: Self-pay

## 2022-03-10 LAB — SURGICAL PATHOLOGY

## 2022-03-10 MED ORDER — ACETAMINOPHEN 500 MG PO TABS
1000.0000 mg | ORAL_TABLET | Freq: Four times a day (QID) | ORAL | Status: DC | PRN
  Administered 2022-03-10 (×2): 1000 mg via ORAL
  Filled 2022-03-10 (×2): qty 2

## 2022-03-10 MED ORDER — IBUPROFEN 600 MG PO TABS
600.0000 mg | ORAL_TABLET | Freq: Four times a day (QID) | ORAL | 0 refills | Status: DC | PRN
  Filled 2022-03-10: qty 30, 8d supply, fill #0

## 2022-03-10 MED ORDER — OXYCODONE-ACETAMINOPHEN 5-325 MG PO TABS
1.0000 | ORAL_TABLET | ORAL | 0 refills | Status: DC | PRN
  Filled 2022-03-10: qty 20, 4d supply, fill #0

## 2022-03-10 MED ORDER — ACETAMINOPHEN 500 MG PO TABS
1000.0000 mg | ORAL_TABLET | Freq: Four times a day (QID) | ORAL | 0 refills | Status: DC | PRN
  Filled 2022-03-10: qty 30, 4d supply, fill #0

## 2022-03-10 MED ORDER — IBUPROFEN 600 MG PO TABS
600.0000 mg | ORAL_TABLET | Freq: Four times a day (QID) | ORAL | Status: DC | PRN
  Administered 2022-03-10 (×2): 600 mg via ORAL
  Filled 2022-03-10 (×2): qty 1

## 2022-03-10 NOTE — Clinical Social Work Maternal (Signed)
?CLINICAL SOCIAL WORK MATERNAL/CHILD NOTE ? ?Patient Details  ?Name: Miranda Hawkins ?MRN: [id number] ?Date of Birth: 02/01/1996 ? ?Date:  03/10/2022 ? ?Clinical Social Worker Initiating Note:  Nurse, learning disability Date/Time: Initiated:  03/10/22/1326    ? ?Child's Name:  Miranda Hawkins  ? ?Biological Parents:  Mother, Father ([facility] 11/23/1994; [phone])  ? ?Need for Interpreter:  None  ? ?Reason for Referral:  Current Substance Use/Substance Use During Pregnancy   (hx of THC use.)  ? ?Address:  [address] ?[place] [address]  ?  ?Phone number:  [phone] (home)    ? ?Additional phone number:  ? ?Household Members/Support Persons (HM/SP):    (MOB reported that she resides with her sister) ? ? ?HM/SP Name Relationship DOB or Age  ?HM/SP -1        ?HM/SP -2        ?HM/SP -3        ?HM/SP -4        ?HM/SP -5        ?HM/SP -6        ?HM/SP -7        ?HM/SP -8        ? ? ?Natural Supports (not living in the home):  Extended Family, Immediate Family, Spouse/significant other (MOB shared that FOB's family will also provide supoprts if needed.)  ? ?Professional Supports: None  ? ?Employment: Unemployed  ? ?Type of Work:    ? ?Education:  Some College  ? ?Homebound arranged:   ? ?Financial Resources:  Medicaid  ? ?Other Resources:  WIC  ? ?Cultural/Religious Considerations Which May Impact Care:  Per MOB's Face Sheet, MOB is [place].  ? ?Strengths:  Ability to meet basic needs  , Home prepared for child    ? ?Psychotropic Medications:        ? ?Pediatrician:      ? ?Pediatrician List:  ? ?[facility]    ?[place]    ?[facility]    ?[facility]    ?[facility]    ?[facility]    ? ? ?Pediatrician Fax Number:   ? ?Risk Factors/Current Problems:  Substance Use    ? ?Cognitive State:  Able to Concentrate  , Alert  , Linear Thinking  , Insightful  , Goal Oriented    ? ?Mood/Affect:  Comfortable  , Interested  , Calm  , Happy  , Bright  , Relaxed    ? ?CSW Assessment: CSW met with MOB in  room 329 to complete an assessment for THC hx. When CSW arrived, MOB was attending to infant as evidence by changing infant's diapers.  MOB appeared comfortable caring for infant. CSW explained CSW's role and MOB was receptive to meeting CSW. MOB was polite and forthcoming. ? ?CSW asked about MOB's substance hx. MOB acknowledged a hx of marijuana use and reported that her last use was Nov. 2022. Per MOB, she smoked to increase her appetite. CSW informed MOB of Hospital's Drug Policy and explained CDS is still pending and a CPS report will be made, if warranted. MOB denied any questions or concerns regarding policy.  MOB also declined resources for SA. ? ?MOB shared having all essential items to are for infant and feeling prepared for infant's future discharge.  MOB communicated feeling well informed about  infant's care ? ?MOB declined follow-up from [facility].  ? ?CSW Plan/Description:  No Further Intervention Required/No Barriers to Discharge, Sudden Infant Death Syndrome (SIDS) Education, Perinatal Mood and Anxiety Disorder (  PMADs) Education, Other Patient/Family Education, [place], Other Information/Referral to [employer], CSW Will Continue to Monitor Umbilical Cord Tissue Drug Screen Results and Make Report if Warranted  ? ?Laurey Arrow, MSW, LCSW ?Clinical Social Work ?[phone] ? ? ?Eldo Umanzor D BOYD-GILYARD, LCSW ?03/10/2022, 1:44 PM ? ?

## 2022-03-10 NOTE — Progress Notes (Signed)
Patient ID: Miranda Hawkins, female   DOB: Jun 12, 1996, 26 y.o.   MRN: [id number] ? ? ?Pt has decided would like d/c home.  Incision care and f/u d/w pt.   ?Circumcision planned with urology due to penile curvature.  ?

## 2022-03-10 NOTE — Discharge Summary (Signed)
? ?  Postpartum Discharge Summary ? ? ? ?   ?Patient Name: Miranda Hawkins ?DOB: September 03, 1996 ?MRN: [id number] ? ?Date of admission: 03/06/2022 ?Delivery date:03/07/2022  ?Delivering provider: Carrington Clamp  ?Date of discharge: 03/10/2022 ? ?Admitting diagnosis: IUGR (intrauterine growth restriction) affecting care of mother [O36.5990] ?Intrauterine pregnancy: [redacted]w[redacted]d     ?Secondary diagnosis:  Principal Problem: ?  IUGR (intrauterine growth restriction) affecting care of mother ? ?Additional problems: None    ?Discharge diagnosis: Term Pregnancy Delivered and IUGR                                               ?Post partum procedures: none ?Augmentation: AROM, Pitocin, and Cytotec ?Complications: None ? ?Hospital course: Induction of Labor With Cesarean Section   ?26 y.o. yo G3P1021 at [redacted]w[redacted]d was admitted to the hospital 03/06/2022 for induction of labor. Patient had a labor course significant for FHR decelerations. The patient went for cesarean section due to  Category 3 tracing. . Delivery details are as follows: ?Membrane Rupture Time/Date: 6:58 PM ,03/06/2022   ?Delivery Method:C-Section, Low Transverse  ?Details of operation can be found in separate operative Note.  Patient had an uncomplicated postpartum course. She is ambulating, tolerating a regular diet, passing flatus, and urinating well.  Patient is discharged home in stable condition on 03/10/22.     ? ?Newborn Data: ?Birth date:03/07/2022  ?Birth time:12:52 AM  ?Gender:Female  ?Living status:Living  ?Apgars:8 ,9  ?Weight:1950 g                               ? ? ? ?Physical exam  ?Vitals:  ? 03/08/22 2032 03/09/22 0517 03/09/22 1930 03/10/22 0525  ?BP: 107/67 102/72 121/74 113/77  ?Pulse: 70 62 78 81  ?Resp: 20 18 16 18   ?Temp: 98 ?F (36.7 ?C) 98 ?F (36.7 ?C) 98.2 ?F (36.8 ?C) 98 ?F (36.7 ?C)  ?TempSrc: Oral Oral Oral Oral  ?SpO2: 97% 98% 100%   ?Weight:      ?Height:      ? ?General: alert and cooperative ?Lochia: appropriate ?Uterine Fundus: firm ?Incision: Dressing  is clean, dry, and intact ? ?Labs: ?Lab Results  ?Component Value Date  ? WBC 10.7 (H) 03/07/2022  ? HGB 10.6 (L) 03/07/2022  ? HCT 31.7 (L) 03/07/2022  ? MCV 85.2 03/07/2022  ? PLT 235 03/07/2022  ? ? ?  Latest Ref Rng & Units 08/03/2016  ? 12:07 PM  ?CMP  ?Total Protein 6.0 - 8.3 g/dL 7.4    ?Total Bilirubin 0.2 - 1.2 mg/dL 0.3    ?Alkaline Phos 39 - 117 U/L 95    ?AST 0 - 37 U/L 11    ?ALT 0 - 35 U/L 8    ? ?Edinburgh Score: ?   ? View : No data to display.  ?  ?  ?  ? ? ? ?After visit meds:  ?Allergies as of 03/10/2022   ?No Known Allergies ?  ? ?  ?Medication List  ?  ? ?STOP taking these medications   ? ?aspirin 81 MG chewable tablet ?  ?famotidine 20 MG tablet ?Commonly known as: PEPCID ?  ?valACYclovir 500 MG tablet ?Commonly known as: Valtrex ?  ? ?  ? ?TAKE these medications   ? ?acetaminophen 500 MG tablet ?Commonly known as: TYLENOL ?  Take 2 tablets (1,000 mg total) by mouth every 6 (six) hours as needed for mild pain. ?  ?ibuprofen 600 MG tablet ?Commonly known as: ADVIL ?Take 1 tablet (600 mg total) by mouth every 6 (six) hours as needed for cramping or moderate pain. ?  ?oxyCODONE-acetaminophen 5-325 MG tablet ?Commonly known as: PERCOCET/ROXICET ?Take 1-2 tablets by mouth every 4 (four) hours as needed for moderate pain. ?  ?prenatal vitamin w/FE, FA 27-1 MG Tabs tablet ?Take 1 tablet by mouth daily at 12 noon. ?  ? ?  ? ? ? ?Discharge home in stable condition ?Infant Feeding: Bottle and Breast ?Infant Disposition:NICU ?Discharge instruction: per After Visit Summary and Postpartum booklet. ?Activity: Advance as tolerated. Pelvic rest for 6 weeks.  ?Diet: routine diet ?Future Appointments:No future appointments. ?Follow up Visit: ? Follow-up Information   ? ? Carrington Clamp, MD. Schedule an appointment as soon as possible for a visit in 2 week(s).   ?Specialty: Obstetrics and Gynecology ?Why: Incision check ?Contact information: ?[address] ?[address] ?[place] [address] ?[phone] ? ? ?   ?  ? ?  ?  ? ?  ? ? ? ?Please schedule this patient for a In person postpartum visit in  2 weeks  with the following provider: MD. ? ?Delivery mode:  C-Section, Low Transverse  ?Anticipated Birth Control:  Unsure ? ? ?03/10/2022 ?Oliver Pila, MD ? ? ? ?

## 2022-03-10 NOTE — Progress Notes (Addendum)
Discharge teaching completed @1615 .  ?Patient not removed from Epic (so she can still get meals, etc.) because of anticipated transfer of baby from couplet care room this evening, then she will be removed from system with baby transfer.  ?

## 2022-03-10 NOTE — Lactation Note (Signed)
This note was copied from a baby's chart. ? ?NICU Lactation Consultation Note ? ?Patient Name: Miranda Hawkins ?Today's Date: 03/10/2022 ?Age:28 days ? ? ?Subjective ?Reason for consult: Follow-up assessment; Primapara; 1st time breastfeeding; NICU baby; Early term 37-38.6wks; Infant < 6lbs ? ?Lactation followed up with Ms. Arlana Pouch. She is nearing maternal discharge today. She reports that she has pumped in the last 24 hours. I educated her on the supply and demand nature of breast milk production and this critical period in her milk production cycle. She verbalized understanding. ? ?I recommended taking her pump parts with her (including tubing and cups) to the new room if Marin Comment is moved today. ? ?Ms. Pitz indicates that she is bottle feeding exclusively (not breast feeding). She has all needed supplies. ? ?She has not noted any breast changes yet (82 hours postpartum). She would like lactation to follow up tomorrow. ? ?Objective ?Infant data: ?Mother's Current Feeding Choice: Breast Milk and Formula ? ?Infant feeding assessment ?Scale for Readiness: 1 ?Scale for Quality: 2 ? ? ?Maternal data: ?[id number]  ?C-Section, Low Transverse ? ?Current breast feeding challenges:: NICU ? ? ?Does the patient have breastfeeding experience prior to this delivery?: No ? ?Pumping frequency: 0 times last 24 hours ?Pumped volume: 0 mL ? ? ?WIC Program: Yes ?WIC Referral Sent?: Yes ?Pump: DEBP, Personal (Medline DEBP, and Handsfree personal pump) ? ? ?Maternal: ?Milk volume: Low ? ? ?Intervention/Plan ?Interventions: Breast feeding basics reviewed; Education ? ?Tools: Pump ?Pump Education: Setup, frequency, and cleaning ? ?Plan: ?Consult Status: Follow-up ? ?NICU Follow-up type: New admission follow up; Verify onset of copious milk; Verify absence of engorgement ? ? ? ?Walker Shadow ?03/10/2022, 11:54 AM ?

## 2022-03-10 NOTE — Progress Notes (Signed)
Subjective: ?Postpartum Day 3: Cesarean Delivery ?Patient reports tolerating PO, + flatus, + BM, and no problems voiding.   ?Pain manageable with po meds. ?Baby starting po feeds. ? ?Objective: ?Vital signs in last 24 hours: ?Temp:  [98 ?F (36.7 ?C)-98.2 ?F (36.8 ?C)] 98 ?F (36.7 ?C) (04/04 0525) ?Pulse Rate:  [78-81] 81 (04/04 0525) ?Resp:  [16-18] 18 (04/04 0525) ?BP: (113-121)/(74-77) 113/77 (04/04 0525) ?SpO2:  [100 %] 100 % (04/03 1930) ? ?Physical Exam:  ?General: alert and cooperative ?Lochia: appropriate ?Uterine Fundus: firm ?Incision: Pressure dressing still in place, will get RN to remove ? ? ?No results for input(s): HGB, HCT in the last 72 hours. ? ?Assessment/Plan: ?Status post Cesarean section. Doing well postoperatively.  ?Continue current care.  Baby may be ready for d/c tomorrow, so patient would like to stay additional day with baby in couplet care.  ? ?Miranda Hawkins ?03/10/2022, 12:56 PM ? ? ?

## 2022-03-11 ENCOUNTER — Ambulatory Visit: Payer: Self-pay

## 2022-03-11 NOTE — Lactation Note (Signed)
This note was copied from a baby's chart. ?Lactation Consultation Note ? ?Patient Name: Miranda Hawkins ?Today's Date: 03/11/2022 ?Reason for consult: Follow-up assessment;Infant < 6lbs;NICU baby ?Age:28 days ? ?[facility] student in to see mom and infant "Marin Comment" who is going home today. Mom says she has 2 pumps at home (portable and double electric) and she has all the things she needs. She has not been pumping because she has not been settled at home. Emphasized importance of pumping q3 to bring in and maintain copious milk supply since mom's goal is to exclusively pumping and bottle feed. Mom reports breast fullness, but no redness or hardening.  ? ?LC student brought ice packs to mom and instructed her to use them for 10-15 minutes in between pump sessions as needed. Reviewed s/s of engorgement and ABM protocol for engorgement. Follow up with outpatient lactation as needed. ? ?Discharge ?Discharge Education: Engorgement and breast care;Warning signs for feeding baby;Outpatient recommendation ? ?Consult Status ?Consult Status: Complete ? ?Antionette Char ?03/11/2022, 1:02 PM ? ? ? ?

## 2022-03-12 ENCOUNTER — Other Ambulatory Visit: Payer: Self-pay | Admitting: Pediatrics

## 2022-03-18 ENCOUNTER — Telehealth (HOSPITAL_COMMUNITY): Payer: Self-pay

## 2022-03-18 NOTE — Telephone Encounter (Signed)
"  I'm good. When will my swelling go away? It has went away in my hips and legs but I still have some swelling in my feet?" RN reviewed fluids shifts that take place in our body after delivery and typically we see that swelling resolve within a few weeks after delivery. Patient declines any headache, visual disturbances, swelling of hands and face, right upper abdominal pain, or high BP readings. RN reviewed signs and symptoms of pre eclampsia to report to her OB-GYN. RN encouraged patient to reach out to her OB-GYN if swelling does not resolve or if she is concerned or has questions. Patient declines any other questions or concerns at this time.  ? ?"Baby is growing. He had an appointment on Thursday and he had gained to 4lb 10oz. So he is definitely getting up there. He is eating well. Sometimes he strains when he has a bowel movement. Is there anything I can do to help with that?" RN recommended moving baby's legs gently in a bicycle motion, some gently massage on abdomen, or wiping baby's bottom with a warm wet washcloth to help him pass stool. RN told patient to speak with her pediatrician about her concern. "Baby sleeps in a bedside bassinet." RN reviewed ABC's of safe sleep with patient. Patient declines any questions or concerns about baby. ? ?EPDS score is 11. Will notify OB-GYN via fax notification.  ?RN told patient about Maternal Mental Health Resources ([facility], National Maternal Mental Health Hotline, Postpartum Support International). Also told patient about [facility] support group offerings. Will e-mail these resources to patient as well. ? Heron Nay ?03/18/2022,1806 ?

## 2022-03-27 ENCOUNTER — Inpatient Hospital Stay (HOSPITAL_COMMUNITY): Admit: 2022-03-27 | Payer: Self-pay

## 2022-05-08 ENCOUNTER — Other Ambulatory Visit (HOSPITAL_COMMUNITY): Payer: Self-pay

## 2022-05-08 MED ORDER — NORETHINDRONE 0.35 MG PO TABS
1.0000 | ORAL_TABLET | Freq: Every day | ORAL | 4 refills | Status: DC
  Filled 2022-05-08: qty 84, 84d supply, fill #0

## 2022-05-18 ENCOUNTER — Other Ambulatory Visit (HOSPITAL_COMMUNITY): Payer: Self-pay

## 2022-09-13 ENCOUNTER — Inpatient Hospital Stay (HOSPITAL_COMMUNITY)
Admission: AD | Admit: 2022-09-13 | Discharge: 2022-09-13 | Disposition: A | Discharge status: Home / Self Care | Payer: Medicaid Other | Attending: Obstetrics | Admitting: Obstetrics

## 2022-09-13 ENCOUNTER — Inpatient Hospital Stay (HOSPITAL_COMMUNITY): Payer: Medicaid Other

## 2022-09-13 ENCOUNTER — Encounter (HOSPITAL_COMMUNITY): Payer: Self-pay

## 2022-09-13 DIAGNOSIS — O23591 Infection of other part of genital tract in pregnancy, first trimester: Secondary | ICD-10-CM | POA: Diagnosis not present

## 2022-09-13 DIAGNOSIS — Z3A01 Less than 8 weeks gestation of pregnancy: Secondary | ICD-10-CM | POA: Diagnosis not present

## 2022-09-13 DIAGNOSIS — N898 Other specified noninflammatory disorders of vagina: Secondary | ICD-10-CM | POA: Diagnosis not present

## 2022-09-13 DIAGNOSIS — B9689 Other specified bacterial agents as the cause of diseases classified elsewhere: Secondary | ICD-10-CM

## 2022-09-13 DIAGNOSIS — O208 Other hemorrhage in early pregnancy: Secondary | ICD-10-CM | POA: Diagnosis present

## 2022-09-13 DIAGNOSIS — O26891 Other specified pregnancy related conditions, first trimester: Secondary | ICD-10-CM

## 2022-09-13 DIAGNOSIS — Z3201 Encounter for pregnancy test, result positive: Secondary | ICD-10-CM

## 2022-09-13 DIAGNOSIS — N760 Acute vaginitis: Secondary | ICD-10-CM | POA: Diagnosis not present

## 2022-09-13 LAB — CBC
HCT: 37.9 % (ref 36.0–46.0)
Hemoglobin: 12.6 g/dL (ref 12.0–15.0)
MCH: 27.2 pg (ref 26.0–34.0)
MCHC: 33.2 g/dL (ref 30.0–36.0)
MCV: 81.7 fL (ref 80.0–100.0)
Platelets: 355 10*3/uL (ref 150–400)
RBC: 4.64 MIL/uL (ref 3.87–5.11)
RDW: 16.5 % — ABNORMAL HIGH (ref 11.5–15.5)
WBC: 12.5 10*3/uL — ABNORMAL HIGH (ref 4.0–10.5)
nRBC: 0 % (ref 0.0–0.2)

## 2022-09-13 LAB — URINALYSIS, ROUTINE W REFLEX MICROSCOPIC
Bilirubin Urine: NEGATIVE
Glucose, UA: NEGATIVE mg/dL
Hgb urine dipstick: NEGATIVE
Ketones, ur: 5 mg/dL — AB
Leukocytes,Ua: NEGATIVE
Nitrite: NEGATIVE
Protein, ur: 100 mg/dL — AB
Specific Gravity, Urine: 1.011 (ref 1.005–1.030)
pH: 6 (ref 5.0–8.0)

## 2022-09-13 LAB — ABO/RH
ABO/RH(D): B POS
ABO/RH(D): B POS

## 2022-09-13 LAB — WET PREP, GENITAL
Sperm: NONE SEEN
Trich, Wet Prep: NONE SEEN
WBC, Wet Prep HPF POC: <10 (ref <10)
Yeast Wet Prep HPF POC: NONE SEEN

## 2022-09-13 LAB — HCG, QUANTITATIVE, PREGNANCY: hCG, Beta Chain, Quant, S: 20293 m[IU]/mL — ABNORMAL HIGH (ref <5)

## 2022-09-13 LAB — POCT PREGNANCY, URINE: Preg Test, Ur: POSITIVE — AB

## 2022-09-13 MED ORDER — METRONIDAZOLE 500 MG PO TABS
500.0000 mg | ORAL_TABLET | Freq: Two times a day (BID) | ORAL | 0 refills | Status: DC
  Filled 2022-09-13: qty 14, 7d supply, fill #0

## 2022-09-13 NOTE — MAU Provider Note (Signed)
History     CSN: [id number]  Arrival date and time: 09/13/22 1427   Event Date/Time   First Provider Initiated Contact with Patient 09/13/22 1542      Chief Complaint  Patient presents with   Vaginal Bleeding   Miranda Hawkins, a  26 y.o. [id number] at 2632w6d presents to MAU with complaints of Dark red/brown bleeding and Spotting since September 28., mainly when wiping.  Patient states LMP Aug 21, 23 and endorses regular periods. She currently Denies any pain. She also noted Nausea starting last Friday, no episodes of vomiting. Denies urinary symptoms. 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 She noted vaginal Itching with foul odor. Also desires STD screening.     OB History     Gravida  4   Para  1   Term  1   Preterm      AB  2   Living  1      SAB      IAB  2   Ectopic      Multiple  0   Live Births  1           Past Medical History:  Diagnosis Date   Abdominal pain    Fatigue    Gastroparesis    GERD (gastroesophageal reflux disease)    Headache(784.0)    Helicobacter pylori (H. pylori)    Hoarseness    Hypertension    high blood pressure readings   Nausea vomiting and diarrhea    Night sweats    Obesity    Rectal bleeding    Ulcer    Wears glasses     Past Surgical History:  Procedure Laterality Date   CESAREAN SECTION N/A 03/07/2022   Procedure: CESAREAN SECTION;  Surgeon: Carrington ClampHorvath, Michelle, MD;  Location: [facility] LD [facility]S;  Service: Obstetrics;  Laterality: N/A;   ESOPHAGOGASTRODUODENOSCOPY  02/2011   LAPAROSCOPIC CHOLECYSTECTOMY  05/2011   MOUTH SURGERY  5 years ago   TONSILLECTOMY AND ADENOIDECTOMY  2002    Family History  Problem Relation Age of Onset   Hypertension Mother    Hyperlipidemia Mother    Alcoholism Father    Arthritis Father        paternal grandparents   Diabetes Maternal Grandmother    Arthritis Maternal Grandmother    Heart disease Maternal Grandmother    Diabetes Maternal Aunt    Alcoholism Unknown        maternal  grandparents   Hyperlipidemia Unknown        all four grandparents   Stroke Unknown    Mental illness Unknown        father's side   Heart disease Paternal Grandfather    Colon cancer Neg Hx     Social History   Tobacco Use   Smoking status: Never   Smokeless tobacco: Never  Vaping Use   Vaping Use: Never used  Substance Use Topics   Alcohol use: No    Comment: not while preg   Drug use: Yes    Types: Marijuana    Comment: last used 11/17/21 as of 02/23/2022    Allergies: No Known Allergies  Medications Prior to Admission  Medication Sig Dispense Refill Last Dose   acetaminophen (TYLENOL) 500 MG tablet Take 2 tablets (1,000 mg total) by mouth every 6 (six) hours as needed for mild pain. 30 tablet 0    ibuprofen (ADVIL) 600 MG tablet Take 1 tablet (600 mg total) by mouth every 6 (six) hours as needed  for cramping or moderate pain. 30 tablet 0    norethindrone (ORTHO MICRONOR) 0.35 MG tablet Take 1 tablet (0.35 mg total) by mouth daily. 84 tablet 4    oxyCODONE-acetaminophen (PERCOCET/ROXICET) 5-325 MG tablet Take 1-2 tablets by mouth every 4 (four) hours as needed for moderate pain. 20 tablet 0    prenatal vitamin w/FE, FA (PRENATAL 1 + 1) 27-1 MG TABS tablet Take 1 tablet by mouth daily at 12 noon.       Review of Systems  Constitutional:  Negative for chills, fatigue and fever.  Eyes:  Negative for pain and visual disturbance.  Respiratory:  Negative for apnea, shortness of breath and wheezing.   Cardiovascular:  Negative for chest pain and palpitations.  Gastrointestinal:  Negative for abdominal pain, constipation, diarrhea, nausea and vomiting.  Genitourinary:  Positive for vaginal bleeding and vaginal discharge. Negative for difficulty urinating, dysuria, pelvic pain and vaginal pain.  Musculoskeletal:  Negative for back pain.  Neurological:  Negative for seizures, weakness and headaches.  Psychiatric/Behavioral:  Negative for suicidal ideas.    Physical Exam    Blood pressure 130/81, pulse 81, temperature 98.2 F (36.8 C), temperature source Oral, resp. rate 15, height 5\' 6"  (1.676 m), weight 124.2 kg, last menstrual period 07/27/2022, SpO2 100 %, unknown if currently breastfeeding.  Physical Exam Vitals and nursing note reviewed. Exam conducted with a chaperone present.  Constitutional:      General: She is not in acute distress.    Appearance: Normal appearance.  HENT:     Head: Normocephalic.  Cardiovascular:     Rate and Rhythm: Normal rate and regular rhythm.  Pulmonary:     Effort: Pulmonary effort is normal.  Abdominal:     General: Abdomen is flat.     Palpations: Abdomen is soft.     Tenderness: There is no abdominal tenderness. There is no right CVA tenderness or left CVA tenderness.  Genitourinary:    General: Normal vulva.     Exam position: Lithotomy position.     Vagina: Vaginal discharge present.     Cervix: Normal.     Comments: Thick dark brown vaginal discharge noted in vaginal vault. Cervix pink and non-friable. +Whiff test  Musculoskeletal:        General: Normal range of motion.     Cervical back: Normal range of motion.  Skin:    General: Skin is warm and dry.     Capillary Refill: Capillary refill takes less than 2 seconds.  Neurological:     Mental Status: She is alert and oriented to person, place, and time.  Psychiatric:        Mood and Affect: Mood normal.     MAU Course  Procedures Orders Placed This Encounter  Procedures   Wet prep, genital   US OB LESS THAN 14 WEEKS WITH OB TRANSVAGINAL   Urinalysis, Routine w reflex microscopic Urine, Clean Catch   CBC   hCG, quantitative, pregnancy   CBC   CBC   hCG, quantitative, pregnancy   Diet NPO time specified   Pregnancy, urine POC   ABO/Rh   ABO/Rh   Discharge patient   Results for orders placed or performed during the hospital encounter of 09/13/22 (from the past 24 hour(s))  Pregnancy, urine POC     Status: Abnormal   Collection Time:  09/13/22  2:56 PM  Result Value Ref Range   Preg Test, Ur POSITIVE (A) NEGATIVE  CBC     Status: None  Collection Time: 09/13/22  3:54 PM  Result Value Ref Range   WBC  4.0 - 10.5 K/uL    PATIENT IDENTIFICATION ERROR. PLEASE DISREGARD RESULTS. ACCOUNT WILL BE CREDITED.   RBC  3.87 - 5.11 MIL/uL    PATIENT IDENTIFICATION ERROR. PLEASE DISREGARD RESULTS. ACCOUNT WILL BE CREDITED.   Hemoglobin  12.0 - 15.0 g/dL    PATIENT IDENTIFICATION ERROR. PLEASE DISREGARD RESULTS. ACCOUNT WILL BE CREDITED.   HCT  36.0 - 46.0 %    PATIENT IDENTIFICATION ERROR. PLEASE DISREGARD RESULTS. ACCOUNT WILL BE CREDITED.   MCV  80.0 - 100.0 fL    PATIENT IDENTIFICATION ERROR. PLEASE DISREGARD RESULTS. ACCOUNT WILL BE CREDITED.   MCH  26.0 - 34.0 pg    PATIENT IDENTIFICATION ERROR. PLEASE DISREGARD RESULTS. ACCOUNT WILL BE CREDITED.   MCHC  30.0 - 36.0 g/dL    PATIENT IDENTIFICATION ERROR. PLEASE DISREGARD RESULTS. ACCOUNT WILL BE CREDITED.   RDW  11.5 - 15.5 %    PATIENT IDENTIFICATION ERROR. PLEASE DISREGARD RESULTS. ACCOUNT WILL BE CREDITED.   Platelets  150 - 400 K/uL    PATIENT IDENTIFICATION ERROR. PLEASE DISREGARD RESULTS. ACCOUNT WILL BE CREDITED.   nRBC  0.0 - 0.2 %    PATIENT IDENTIFICATION ERROR. PLEASE DISREGARD RESULTS. ACCOUNT WILL BE CREDITED.  ABO/Rh     Status: None   Collection Time: 09/13/22  3:54 PM  Result Value Ref Range   ABO/RH(D) B POS    No rh immune globuloin      NOT A RH IMMUNE GLOBULIN CANDIDATE, PT RH POSITIVE Performed at [facility], [address], [place], [place] [address]   hCG, quantitative, pregnancy     Status: None   Collection Time: 09/13/22  3:54 PM  Result Value Ref Range   hCG, Beta Chain, Quant, S  <5 mIU/mL    PATIENT IDENTIFICATION ERROR. PLEASE DISREGARD RESULTS. ACCOUNT WILL BE CREDITED.  Urinalysis, Routine w reflex microscopic Urine, Clean Catch     Status: Abnormal   Collection Time: 09/13/22  3:59 PM  Result Value Ref Range   Color, Urine  YELLOW YELLOW   APPearance CLEAR CLEAR   Specific Gravity, Urine 1.011 1.005 - 1.030   pH 6.0 5.0 - 8.0   Glucose, UA NEGATIVE NEGATIVE mg/dL   Hgb urine dipstick NEGATIVE NEGATIVE   Bilirubin Urine NEGATIVE NEGATIVE   Ketones, ur 5 (A) NEGATIVE mg/dL   Protein, ur 914 (A) NEGATIVE mg/dL   Nitrite NEGATIVE NEGATIVE   Leukocytes,Ua NEGATIVE NEGATIVE   RBC / HPF 0-5 0 - 5 RBC/hpf   WBC, UA 0-5 0 - 5 WBC/hpf   Bacteria, UA RARE (A) NONE SEEN   Squamous Epithelial / LPF 0-5 0 - 5   Mucus PRESENT   Wet prep, genital     Status: Abnormal   Collection Time: 09/13/22  4:10 PM   Specimen: PATH Cytology Cervicovaginal Ancillary Only  Result Value Ref Range   Yeast Wet Prep HPF POC NONE SEEN NONE SEEN   Trich, Wet Prep NONE SEEN NONE SEEN   Clue Cells Wet Prep HPF POC PRESENT (A) NONE SEEN   WBC, Wet Prep HPF POC <10 <10   Sperm NONE SEEN   CBC     Status: None   Collection Time: 09/13/22  5:46 PM  Result Value Ref Range   WBC DUPLICATE REQUEST 4.0 - 10.5 K/uL   RBC DUPLICATE REQUEST 3.87 - 5.11 MIL/uL   Hemoglobin DUPLICATE REQUEST 12.0 - 15.0 g/dL  HCT DUPLICATE REQUEST 16.1 - 09.6 %   MCV DUPLICATE REQUEST 04.5 - 409.8 fL   MCH DUPLICATE REQUEST 11.9 - 14.7 pg   MCHC DUPLICATE REQUEST 82.9 - 56.2 g/dL   RDW DUPLICATE REQUEST 13.0 - 15.5 %   Platelets DUPLICATE REQUEST 865 - 784 K/uL   nRBC DUPLICATE REQUEST 0.0 - 0.2 %  CBC     Status: Abnormal   Collection Time: 09/13/22  5:57 PM  Result Value Ref Range   WBC 12.5 (H) 4.0 - 10.5 K/uL   RBC 4.64 3.87 - 5.11 MIL/uL   Hemoglobin 12.6 12.0 - 15.0 g/dL   HCT 37.9 36.0 - 46.0 %   MCV 81.7 80.0 - 100.0 fL   MCH 27.2 26.0 - 34.0 pg   MCHC 33.2 30.0 - 36.0 g/dL   RDW 16.5 (H) 11.5 - 15.5 %   Platelets 355 150 - 400 K/uL   nRBC 0.0 0.0 - 0.2 %  hCG, quantitative, pregnancy     Status: Abnormal   Collection Time: 09/13/22  5:57 PM  Result Value Ref Range   hCG, Beta Chain, Quant, S 20,293 (H) <5 mIU/mL  ABO/Rh     Status: None    Collection Time: 09/13/22  5:57 PM  Result Value Ref Range   ABO/RH(D) B POS    No rh immune globuloin      NOT A RH IMMUNE GLOBULIN CANDIDATE, PT RH POSITIVE Performed at [facility], [place] [address] [place], [place] [address]    US OB LESS THAN 14 WEEKS WITH [place] TRANSVAGINAL  Result Date: 09/13/2022 CLINICAL DATA:  Vaginal bleeding. EXAM: OBSTETRIC <14 WK [place] AND TRANSVAGINAL OB US TECHNIQUE: Both transabdominal and transvaginal ultrasound examinations were performed for complete evaluation of the gestation as well as the maternal uterus, adnexal regions, and pelvic cul-de-sac. Transvaginal technique was performed to assess early pregnancy. COMPARISON:  None Available. FINDINGS: Intrauterine gestational sac: Single Yolk sac:  Visualized. Embryo:  Visualized. Cardiac Activity: Visualized. Heart Rate: 111 bpm CRL:  7.2 mm   6 w   4 d                  [place] EDC: Subchorionic hemorrhage:  Small (1.6 cm x 0.8 cm x 2.4 cm) Maternal uterus/adnexae: The right ovary measures 2.1 cm x 1.3 cm x 2.1 cm. The left ovary measures 4.3 cm x 2.8 cm x 3.6 cm. A small amount of free fluid is seen posterior to the cervix. IMPRESSION: 1. Single, viable intrauterine pregnancy at approximately 6 weeks and 4 days gestation by ultrasound evaluation. 2. Small subchorionic hemorrhage. 3. Small amount of posterior pelvic free fluid. Electronically Signed   By: Virgina Norfolk M.D.   On: 09/13/2022 17:56    MDM Lab results reviewed and interpreted by me.   - Quant 20,293 consistent with a 6 weeks fetus.  - White count elevated at 12.5 Hbg and Platelet count normal, Patient hemodynamically stable . - Wet Prep positive for clue cells with positive whiff test consistent with BV.  - [place] results single living IUP with a small [place]. Most likely cause of vaginal bleeding - Low suspicion for Ectopic pregnancy.   - Plan for discharge   Assessment and Plan   1. Pregnancy confirmed by positive blood test   2. Bacterial  vaginosis   3. [redacted] weeks gestation of pregnancy   4. Vaginal discharge during pregnancy in first trimester    - Discussed that vaginal bleeding is likely related  to Subchorionic Hemorrhage. Bleeding expectations and precautions reviewed.  - Patient undecided on continuing with pregnancy. Information provided on planned parenthood and Prenatal care offices. Pregnancy confirmation provided.  - Discussed that patient has BV. Rx sent to outpatient pharmacy and admin instructions provided at bedside.  - Will notify patient of GC results if necessary.  - Patient discharged home in stable condition and may return to MAU as needed.   Jacquiline Doe, MSN CNM  09/13/2022, 3:42 PM

## 2022-09-13 NOTE — Discharge Instructions (Signed)
Prenatal Care Providers           [facility] [place] @ [facility] [facility]  [place] [phone]  [facility] for [facility] @ [place]  [phone]  [place] @ [facility]       [address] [phone]            [facility] [place] @ [place]     [phone]          [facility] [place] @ [facility]   [address] [phone]  [facility] [place] @ [place] [phone]     [facility] [place] @ [address] Linna Hoff)  [place]   [phone]     [place] [facility]  Phone: [facility]  Phone: [facility] Phone: [phone]  [facility] Phone: [phone]  [facility] Phone: [phone]  [facility] Phone: [phone]  [facility]  Phone: [phone]   [facility] Address: [place]. [address], [place], [place] [address] Hours:  Monday 9AM-5PM Tuesday 10AM-6PM Wednesday 11AM-7PM Thursday 9AM-5PM Friday              8AM-2PM Saturday Sunday  Phone: [phone]

## 2022-09-13 NOTE — MAU Note (Signed)
Pt reports to mau after being seen at [facility] for abnormal dc, she was told she was preg and to go to MAU for further evaluation.  Pt states she is having some brown dc, itching, odor and would like to be checked for STDs.  Denies pain today.

## 2022-09-14 ENCOUNTER — Other Ambulatory Visit (HOSPITAL_COMMUNITY): Payer: Self-pay

## 2022-09-14 LAB — GC/CHLAMYDIA PROBE AMP (~~LOC~~) NOT AT ARMC
Chlamydia: NEGATIVE
Comment: NEGATIVE
Comment: NORMAL
Neisseria Gonorrhea: NEGATIVE

## 2022-10-07 DIAGNOSIS — O021 Missed abortion: Secondary | ICD-10-CM

## 2022-10-07 HISTORY — DX: Missed abortion: O02.1

## 2022-10-09 ENCOUNTER — Other Ambulatory Visit: Payer: Self-pay | Admitting: Obstetrics and Gynecology

## 2022-10-09 ENCOUNTER — Encounter (HOSPITAL_BASED_OUTPATIENT_CLINIC_OR_DEPARTMENT_OTHER): Payer: Self-pay | Admitting: Obstetrics and Gynecology

## 2022-10-09 DIAGNOSIS — Z01818 Encounter for other preprocedural examination: Secondary | ICD-10-CM

## 2022-10-09 NOTE — H&P (Signed)
26 y.o. [id number] with 6-7 week miscarriage.  Past Medical History:  Diagnosis Date   GERD (gastroesophageal reflux disease)    History of gastric ulcer    hx  gastroparesis 2013,  per test 09/ 2017 resolved   History of Helicobacter pylori infection 2012   treated   Missed ab 10/2022   Wears glasses    Past Surgical History:  Procedure Laterality Date   CESAREAN SECTION N/A 03/07/2022   Procedure: CESAREAN SECTION;  Surgeon: Bobbye Charleston, MD;  Location: [place] LD ORS;  Service: Obstetrics;  Laterality: N/A;   ESOPHAGOGASTRODUODENOSCOPY  02/05/2011   dr Loletha Carrow   LAPAROSCOPIC CHOLECYSTECTOMY  05/13/2011   @[facility]  by dr Excell Seltzer   TONSILLECTOMY AND ADENOIDECTOMY  05/10/2001   @[facility]  by dr Lucia Gaskins    Social History   Socioeconomic History   Marital status: Single    Spouse name: Not on file   Number of children: 0   Years of education: Not on file   Highest education level: Not on file  Occupational History   Occupation: Student   Tobacco Use   Smoking status: Never   Smokeless tobacco: Never  Vaping Use   Vaping Use: Never used  Substance and Sexual Activity   Alcohol use: Not Currently    Comment: not while preg   Drug use: Not Currently    Types: Marijuana   Sexual activity: Yes    Birth control/protection: None  Other Topics Concern   Not on file  Social History Narrative   Daily caffeine       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   Current  Outpatient Medications on File Prior to Encounter  Medication Sig Dispense Refill   acetaminophen (TYLENOL) 500 MG tablet Take 500 mg by mouth every 6 (six) hours as needed.     ibuprofen (ADVIL) 200 MG tablet Take 200 mg by mouth every 6 (six) hours as needed.     prenatal vitamin w/FE, FA (PRENATAL 1 + 1) 27-1 MG TABS tablet Take 1 tablet by mouth daily at 12 noon. (Patient not taking: Reported on 10/09/2022)      No Known Allergies  Vitals:   10/09/22 1050  Weight: 122.5 kg  Height: 5\' 5"  (1.651 m)    Lungs: clear to ascultation Cor:  RRR Abdomen:  soft, nontender, nondistended. Ex:  no cords, erythema Pelvic:   NEFG, normal size uterus with closed cervix  A:  Miscarriage at about 6-7 weeks.  Confirmed no FHTs and no growth on [place].  Pt counseled and desires to proceed with suction D&E.   P: P: All risks, benefits and alternatives d/w patient and she desires to proceed.  Patient has undergone ERAS protocol and will receive preop antibiotics and SCDs during the operation.      Daria Pastures

## 2022-10-09 NOTE — Progress Notes (Signed)
Spoke w/ via phone for pre-op interview--- pt Lab needs dos---- cbc, t&s              Lab results------ no COVID test -----patient states asymptomatic no test needed Arrive at ------- 0530 on 10-13-2022 NPO after MN NO Solid Food.  Clear liquids from MN until--- 0430 Med rec completed Medications to take morning of surgery ----- none Diabetic medication ----- n/a Patient instructed no nail polish to be worn day of surgery Patient instructed to bring photo id and insurance card day of surgery Patient aware to have Driver (ride ) / caregiver  for 24 hours after surgery -- family, [place] Patient Special Instructions ----- n/a Pre-Op special Istructions ----- n/a Patient verbalized understanding of instructions that were given at this phone interview. Patient denies shortness of breath, chest pain, fever, cough at this phone interview.

## 2022-10-12 NOTE — Anesthesia Preprocedure Evaluation (Signed)
Anesthesia Evaluation  Patient identified by MRN, date of birth, ID band Patient awake    Reviewed: Allergy & Precautions, NPO status , Patient's Chart, lab work & pertinent test results  History of Anesthesia Complications Negative for: history of anesthetic complications  Airway Mallampati: I  TM Distance: >3 FB Neck ROM: Full    Dental no notable dental hx.    Pulmonary neg pulmonary ROS   Pulmonary exam normal        Cardiovascular negative cardio ROS Normal cardiovascular exam     Neuro/Psych negative neurological ROS  negative psych ROS   GI/Hepatic Neg liver ROS,GERD  ,,gastroparesis   Endo/Other    Morbid obesity  Renal/GU negative Renal ROS  negative genitourinary   Musculoskeletal negative musculoskeletal ROS (+)    Abdominal   Peds  Hematology negative hematology ROS (+)   Anesthesia Other Findings Day of surgery medications reviewed with patient.  Reproductive/Obstetrics Missed Ab, 6-7w EGA                             Anesthesia Physical Anesthesia Plan  ASA: 3  Anesthesia Plan: General   Post-op Pain Management: Tylenol PO (pre-op)* and Toradol IV (intra-op)*   Induction: Intravenous  PONV Risk Score and Plan: 4 or greater and Treatment may vary due to age or medical condition, Scopolamine patch - Pre-op, Midazolam, Ondansetron and Dexamethasone  Airway Management Planned: LMA  Additional Equipment: None  Intra-op Plan:   Post-operative Plan: Extubation in OR  Informed Consent: I have reviewed the patients History and Physical, chart, labs and discussed the procedure including the risks, benefits and alternatives for the proposed anesthesia with the patient or authorized representative who has indicated his/her understanding and acceptance.     Dental advisory given  Plan Discussed with: CRNA  Anesthesia Plan Comments:         Anesthesia Quick  Evaluation

## 2022-10-13 ENCOUNTER — Other Ambulatory Visit: Payer: Self-pay

## 2022-10-13 ENCOUNTER — Ambulatory Visit (HOSPITAL_BASED_OUTPATIENT_CLINIC_OR_DEPARTMENT_OTHER)
Admission: RE | Admit: 2022-10-13 | Discharge: 2022-10-13 | Disposition: A | Discharge status: Home / Self Care | Payer: Medicaid Other | Source: Ambulatory Visit | Attending: Obstetrics and Gynecology | Admitting: Obstetrics and Gynecology

## 2022-10-13 ENCOUNTER — Ambulatory Visit (HOSPITAL_BASED_OUTPATIENT_CLINIC_OR_DEPARTMENT_OTHER): Payer: Medicaid Other | Admitting: Anesthesiology

## 2022-10-13 ENCOUNTER — Encounter (HOSPITAL_BASED_OUTPATIENT_CLINIC_OR_DEPARTMENT_OTHER): Payer: Self-pay | Admitting: Obstetrics and Gynecology

## 2022-10-13 ENCOUNTER — Encounter (HOSPITAL_BASED_OUTPATIENT_CLINIC_OR_DEPARTMENT_OTHER)
Admission: RE | Disposition: A | Discharge status: Home / Self Care | Payer: Self-pay | Source: Ambulatory Visit | Attending: Obstetrics and Gynecology

## 2022-10-13 DIAGNOSIS — O99211 Obesity complicating pregnancy, first trimester: Secondary | ICD-10-CM

## 2022-10-13 DIAGNOSIS — O021 Missed abortion: Secondary | ICD-10-CM | POA: Insufficient documentation

## 2022-10-13 DIAGNOSIS — K3184 Gastroparesis: Secondary | ICD-10-CM

## 2022-10-13 DIAGNOSIS — Z01818 Encounter for other preprocedural examination: Secondary | ICD-10-CM

## 2022-10-13 DIAGNOSIS — Z3A01 Less than 8 weeks gestation of pregnancy: Secondary | ICD-10-CM

## 2022-10-13 DIAGNOSIS — Z6841 Body Mass Index (BMI) 40.0 and over, adult: Secondary | ICD-10-CM | POA: Diagnosis not present

## 2022-10-13 DIAGNOSIS — O99611 Diseases of the digestive system complicating pregnancy, first trimester: Secondary | ICD-10-CM | POA: Diagnosis not present

## 2022-10-13 HISTORY — PX: DILATION AND EVACUATION: SHX1459

## 2022-10-13 HISTORY — DX: Personal history of peptic ulcer disease: Z87.11

## 2022-10-13 LAB — CBC
HCT: 41.6 % (ref 36.0–46.0)
Hemoglobin: 12.8 g/dL (ref 12.0–15.0)
MCH: 26.4 pg (ref 26.0–34.0)
MCHC: 30.8 g/dL (ref 30.0–36.0)
MCV: 85.8 fL (ref 80.0–100.0)
Platelets: 379 10*3/uL (ref 150–400)
RBC: 4.85 MIL/uL (ref 3.87–5.11)
RDW: 15.8 % — ABNORMAL HIGH (ref 11.5–15.5)
WBC: 10.7 10*3/uL — ABNORMAL HIGH (ref 4.0–10.5)
nRBC: 0 % (ref 0.0–0.2)

## 2022-10-13 LAB — TYPE AND SCREEN
ABO/RH(D): B POS
Antibody Screen: NEGATIVE

## 2022-10-13 SURGERY — DILATION AND EVACUATION, UTERUS
Anesthesia: General | Site: Vagina

## 2022-10-13 MED ORDER — SUCCINYLCHOLINE CHLORIDE 200 MG/10ML IV SOSY: PREFILLED_SYRINGE | INTRAVENOUS | Status: DC | PRN

## 2022-10-13 MED ORDER — FENTANYL CITRATE (PF) 100 MCG/2ML IJ SOLN
INTRAMUSCULAR | Status: AC
  Filled 2022-10-13: qty 2

## 2022-10-13 MED ORDER — LIDOCAINE HCL (PF) 2 % IJ SOLN
INTRAMUSCULAR | Status: AC
  Filled 2022-10-13: qty 5

## 2022-10-13 MED ORDER — ACETAMINOPHEN 500 MG PO TABS
1000.0000 mg | ORAL_TABLET | ORAL | Status: AC
  Administered 2022-10-13: 1000 mg via ORAL

## 2022-10-13 MED ORDER — PROPOFOL 10 MG/ML IV BOLUS
INTRAVENOUS | Status: AC
  Filled 2022-10-13: qty 20

## 2022-10-13 MED ORDER — LACTATED RINGERS IV SOLN: INTRAVENOUS | Status: DC

## 2022-10-13 MED ORDER — SILVER NITRATE-POT NITRATE 75-25 % EX MISC
CUTANEOUS | Status: DC | PRN
  Administered 2022-10-13: 2

## 2022-10-13 MED ORDER — OXYCODONE HCL 5 MG PO TABS
ORAL_TABLET | ORAL | Status: AC
  Filled 2022-10-13: qty 1

## 2022-10-13 MED ORDER — ONDANSETRON HCL 4 MG/2ML IJ SOLN
INTRAMUSCULAR | Status: AC
  Filled 2022-10-13: qty 2

## 2022-10-13 MED ORDER — TRANEXAMIC ACID-NACL 1000-0.7 MG/100ML-% IV SOLN
INTRAVENOUS | Status: AC
  Filled 2022-10-13: qty 100

## 2022-10-13 MED ORDER — ONDANSETRON HCL 4 MG/2ML IJ SOLN
INTRAMUSCULAR | Status: DC | PRN
  Administered 2022-10-13: 4 mg via INTRAVENOUS

## 2022-10-13 MED ORDER — DEXMEDETOMIDINE HCL IN NACL 200 MCG/50ML IV SOLN
INTRAVENOUS | Status: DC | PRN
  Administered 2022-10-13: 12 ug via INTRAVENOUS

## 2022-10-13 MED ORDER — DEXAMETHASONE SODIUM PHOSPHATE 10 MG/ML IJ SOLN
INTRAMUSCULAR | Status: DC | PRN
  Administered 2022-10-13: 10 mg via INTRAVENOUS

## 2022-10-13 MED ORDER — KETOROLAC TROMETHAMINE 30 MG/ML IJ SOLN
INTRAMUSCULAR | Status: DC | PRN
  Administered 2022-10-13: 30 mg via INTRAVENOUS

## 2022-10-13 MED ORDER — SOD CITRATE-CITRIC ACID 500-334 MG/5ML PO SOLN: 30.0000 mL | ORAL | Status: DC

## 2022-10-13 MED ORDER — AMISULPRIDE (ANTIEMETIC) 5 MG/2ML IV SOLN: 10.0000 mg | Freq: Once | INTRAVENOUS | Status: DC | PRN

## 2022-10-13 MED ORDER — TRANEXAMIC ACID-NACL 1000-0.7 MG/100ML-% IV SOLN
INTRAVENOUS | Status: DC | PRN
  Administered 2022-10-13: 1000 mg via INTRAVENOUS

## 2022-10-13 MED ORDER — SCOPOLAMINE 1 MG/3DAYS TD PT72
1.0000 | MEDICATED_PATCH | Freq: Once | TRANSDERMAL | Status: DC
  Administered 2022-10-13: 1.5 mg via TRANSDERMAL

## 2022-10-13 MED ORDER — SCOPOLAMINE 1 MG/3DAYS TD PT72
MEDICATED_PATCH | TRANSDERMAL | Status: AC
  Filled 2022-10-13: qty 1

## 2022-10-13 MED ORDER — SUCCINYLCHOLINE CHLORIDE 200 MG/10ML IV SOSY
PREFILLED_SYRINGE | INTRAVENOUS | Status: AC
  Filled 2022-10-13: qty 10

## 2022-10-13 MED ORDER — KETOROLAC TROMETHAMINE 30 MG/ML IJ SOLN
INTRAMUSCULAR | Status: AC
  Filled 2022-10-13: qty 4

## 2022-10-13 MED ORDER — FENTANYL CITRATE (PF) 100 MCG/2ML IJ SOLN
INTRAMUSCULAR | Status: DC | PRN
  Administered 2022-10-13 (×4): 50 ug via INTRAVENOUS

## 2022-10-13 MED ORDER — METHYLERGONOVINE MALEATE 0.2 MG/ML IJ SOLN
INTRAMUSCULAR | Status: DC | PRN
  Administered 2022-10-13: .2 mg via INTRAMUSCULAR

## 2022-10-13 MED ORDER — LIDOCAINE 2% (20 MG/ML) 5 ML SYRINGE
INTRAMUSCULAR | Status: DC | PRN
  Administered 2022-10-13: 100 mg via INTRAVENOUS

## 2022-10-13 MED ORDER — ARTIFICIAL TEARS OPHTHALMIC OINT
TOPICAL_OINTMENT | OPHTHALMIC | Status: AC
  Filled 2022-10-13: qty 10.5

## 2022-10-13 MED ORDER — OXYCODONE HCL 5 MG/5ML PO SOLN: 5.0000 mg | Freq: Once | ORAL | Status: AC | PRN

## 2022-10-13 MED ORDER — MIDAZOLAM HCL 2 MG/2ML IJ SOLN
INTRAMUSCULAR | Status: AC
  Filled 2022-10-13: qty 2

## 2022-10-13 MED ORDER — ACETAMINOPHEN 500 MG PO TABS
ORAL_TABLET | ORAL | Status: AC
  Filled 2022-10-13: qty 2

## 2022-10-13 MED ORDER — FENTANYL CITRATE (PF) 100 MCG/2ML IJ SOLN: 25.0000 ug | INTRAMUSCULAR | Status: DC | PRN

## 2022-10-13 MED ORDER — PROPOFOL 10 MG/ML IV BOLUS
INTRAVENOUS | Status: DC | PRN
  Administered 2022-10-13: 200 mg via INTRAVENOUS
  Administered 2022-10-13: 50 mg via INTRAVENOUS

## 2022-10-13 MED ORDER — SUCCINYLCHOLINE CHLORIDE 200 MG/10ML IV SOSY
PREFILLED_SYRINGE | INTRAVENOUS | Status: DC | PRN
  Administered 2022-10-13: 20 mg via INTRAVENOUS

## 2022-10-13 MED ORDER — SODIUM CHLORIDE 0.9 % IV SOLN
100.0000 mg | Freq: Two times a day (BID) | INTRAVENOUS | Status: DC
  Administered 2022-10-13: 100 mg via INTRAVENOUS
  Filled 2022-10-13: qty 100

## 2022-10-13 MED ORDER — POVIDONE-IODINE 10 % EX SWAB: 2.0000 | Freq: Once | CUTANEOUS | Status: DC

## 2022-10-13 MED ORDER — MIDAZOLAM HCL 5 MG/5ML IJ SOLN
INTRAMUSCULAR | Status: DC | PRN
  Administered 2022-10-13: 2 mg via INTRAVENOUS

## 2022-10-13 MED ORDER — OXYCODONE HCL 5 MG PO TABS
5.0000 mg | ORAL_TABLET | Freq: Once | ORAL | Status: AC | PRN
  Administered 2022-10-13: 5 mg via ORAL

## 2022-10-13 MED ORDER — DEXAMETHASONE SODIUM PHOSPHATE 10 MG/ML IJ SOLN
INTRAMUSCULAR | Status: AC
  Filled 2022-10-13: qty 1

## 2022-10-13 MED ORDER — 0.9 % SODIUM CHLORIDE (POUR BTL) OPTIME
TOPICAL | Status: DC | PRN
  Administered 2022-10-13: 500 mL

## 2022-10-13 SURGICAL SUPPLY — 24 items
CATH ROBINSON RED A/P 16FR (CATHETERS) ×2 IMPLANT
DRSG TELFA 3X8 NADH STRL (GAUZE/BANDAGES/DRESSINGS) ×2 IMPLANT
FILTER UTR ASPR ASSEMBLY (MISCELLANEOUS) ×2 IMPLANT
GAUZE 4X4 16PLY ~~LOC~~+RFID DBL (SPONGE) ×2 IMPLANT
GLOVE BIO SURGEON STRL SZ7 (GLOVE) ×2 IMPLANT
GLOVE BIOGEL PI IND STRL 7.0 (GLOVE) ×2 IMPLANT
GOWN STRL REUS W/TWL LRG LVL3 (GOWN DISPOSABLE) ×4 IMPLANT
HOSE CONNECTING 18IN BERKELEY (TUBING) ×2 IMPLANT
KIT BERKELEY 1ST TRI 3/8 NO TR (MISCELLANEOUS) IMPLANT
KIT BERKELEY 1ST TRIMESTER 3/8 (MISCELLANEOUS) ×4 IMPLANT
KIT TURNOVER CYSTO (KITS) ×2 IMPLANT
NS IRRIG 1000ML POUR BTL (IV SOLUTION) ×2 IMPLANT
NS IRRIG 500ML POUR BTL (IV SOLUTION) IMPLANT
PACK VAGINAL MINOR WOMEN LF (CUSTOM PROCEDURE TRAY) ×2 IMPLANT
PAD OB MATERNITY 4.3X12.25 (PERSONAL CARE ITEMS) ×2 IMPLANT
PAD PREP 24X48 CUFFED NSTRL (MISCELLANEOUS) ×2 IMPLANT
SET BERKELEY SUCTION TUBING (SUCTIONS) ×2 IMPLANT
SOL PREP POV-IOD 4OZ 10% (MISCELLANEOUS) IMPLANT
SPIKE FLUID TRANSFER (MISCELLANEOUS) ×2 IMPLANT
TOWEL OR 17X26 10 PK STRL BLUE (TOWEL DISPOSABLE) ×2 IMPLANT
VACURETTE 10 RIGID CVD (CANNULA) IMPLANT
VACURETTE 7MM CVD STRL WRAP (CANNULA) IMPLANT
VACURETTE 8 RIGID CVD (CANNULA) IMPLANT
VACURETTE 9 RIGID CVD (CANNULA) IMPLANT

## 2022-10-13 NOTE — Op Note (Signed)
10/13/2022  7:56 AM  PATIENT:  Miranda Hawkins  26 y.o. female  PRE-OPERATIVE DIAGNOSIS:  MISSED AB  POST-OPERATIVE DIAGNOSIS:  MISSED AB  PROCEDURE:  Procedure(s): DILATATION AND EVACUATION (N/A)  SURGEON:  Surgeon(s) and Role:    * Bobbye Charleston, MD - Primary  ANESTHESIA:   general  EBL:  200 mL   LOCAL MEDICATIONS USED:  NONE  SPECIMEN:  Source of Specimen:  uterine contents  DISPOSITION OF SPECIMEN:  PATHOLOGY  COUNTS:  YES  TOURNIQUET:  * No tourniquets in log *  DICTATION: .Note written in EPIC  PLAN OF CARE: Discharge to home after PACU  PATIENT DISPOSITION:  PACU - hemodynamically stable.   Delay start of Pharmacological VTE agent (>24hrs) due to surgical blood loss or risk of bleeding: not applicable Medications: Methergine, TXA  Complications: None  Findings:  9 week size uterus to 7 size post procedure.  Good crie was achieved.  After adequate anesthesia was achieved, the patient was prepped and draped in the usual sterile fashion.  The speculum was placed in the vagina and the cervix stabilized with a single-tooth tenaculum.  The cervix was dilated with Kennon Rounds dilators and the 9 mm curette was used to remove contents of the uterus.  Alternating sharp curettage with a curette and suction curettage was performed until all contents were removed and good crie was achieved.  All instruments were removed from the vagina.  There was some active bleeding during the curettage which was stopped with methergine and TXA. The patient tolerated the procedure well.    Lexus Barletta A

## 2022-10-13 NOTE — Progress Notes (Signed)
There has been no change in the patients history, status or exam since the history and physical.  Vitals:   10/09/22 1050 10/13/22 0612  BP:  (!) 156/93  Pulse:  89  Resp:  18  Temp:  98.2 F (36.8 C)  TempSrc:  Oral  SpO2:  100%  Weight: 122.5 kg 125.5 kg  Height: 5\' 5"  (1.651 m) 5\' 6"  (1.676 m)    Results for orders placed or performed during the hospital encounter of 10/13/22 (from the past 72 hour(s))  CBC     Status: Abnormal   Collection Time: 10/13/22  6:32 AM  Result Value Ref Range   WBC 10.7 (H) 4.0 - 10.5 K/uL   RBC 4.85 3.87 - 5.11 MIL/uL   Hemoglobin 12.8 12.0 - 15.0 g/dL   HCT 41.6 36.0 - 46.0 %   MCV 85.8 80.0 - 100.0 fL   MCH 26.4 26.0 - 34.0 pg   MCHC 30.8 30.0 - 36.0 g/dL   RDW 15.8 (H) 11.5 - 15.5 %   Platelets 379 150 - 400 K/uL   nRBC 0.0 0.0 - 0.2 %    Comment: Performed at [facility], [place] [address], [place], [place] [address]    [place]

## 2022-10-13 NOTE — Transfer of Care (Signed)
Immediate Anesthesia Transfer of Care Note  Patient: Miranda Hawkins  Procedure(s) Performed: DILATATION AND EVACUATION (Vagina )  Patient Location: PACU  Anesthesia Type:General  Level of Consciousness: awake, alert , and oriented  Airway & Oxygen Therapy: Patient Spontanous Breathing  Post-op Assessment: Report given to RN and Post -op Vital signs reviewed and stable  Post vital signs: Reviewed and stable  Last Vitals:  Vitals Value Taken Time  BP 109/55 10/13/22 0809  Temp    Pulse 73 10/13/22 0814  Resp 12 10/13/22 0814  SpO2 100 % 10/13/22 0814  Vitals shown include unvalidated device data.  Last Pain:  Vitals:   10/13/22 0612  TempSrc: Oral  PainSc: 4       Patients Stated Pain Goal: 6 (44/31/54 0086)  Complications: No notable events documented.

## 2022-10-13 NOTE — Anesthesia Procedure Notes (Signed)
Procedure Name: LMA Insertion Date/Time: 10/13/2022 7:33 AM  Performed by: Rogers Blocker, CRNAPre-anesthesia Checklist: Patient identified, Emergency Drugs available, Suction available and Patient being monitored Patient Re-evaluated:Patient Re-evaluated prior to induction Oxygen Delivery Method: Circle System Utilized Preoxygenation: Pre-oxygenation with 100% oxygen Induction Type: IV induction Ventilation: Mask ventilation without difficulty LMA: LMA inserted LMA Size: 4.0 Number of attempts: 1 Placement Confirmation: positive ETCO2 Tube secured with: Tape Dental Injury: Teeth and Oropharynx as per pre-operative assessment

## 2022-10-13 NOTE — Brief Op Note (Signed)
10/13/2022  7:56 AM  PATIENT:  Miranda Hawkins  26 y.o. female  PRE-OPERATIVE DIAGNOSIS:  MISSED AB  POST-OPERATIVE DIAGNOSIS:  MISSED AB  PROCEDURE:  Procedure(s): DILATATION AND EVACUATION (N/A)  SURGEON:  Surgeon(s) and Role:    * Bobbye Charleston, MD - Primary  ANESTHESIA:   general  EBL:  200 mL   LOCAL MEDICATIONS USED:  NONE  SPECIMEN:  Source of Specimen:  uterine contents  DISPOSITION OF SPECIMEN:  PATHOLOGY  COUNTS:  YES  TOURNIQUET:  * No tourniquets in log *  DICTATION: .Note written in EPIC  PLAN OF CARE: Discharge to home after PACU  PATIENT DISPOSITION:  PACU - hemodynamically stable.   Delay start of Pharmacological VTE agent (>24hrs) due to surgical blood loss or risk of bleeding: not applicable

## 2022-10-13 NOTE — Anesthesia Postprocedure Evaluation (Signed)
Anesthesia Post Note  Patient: Miranda Hawkins  Procedure(s) Performed: DILATATION AND EVACUATION (Vagina )     Patient location during evaluation: PACU Anesthesia Type: General Level of consciousness: awake and alert Pain management: pain level controlled Vital Signs Assessment: post-procedure vital signs reviewed and stable Respiratory status: spontaneous breathing, nonlabored ventilation and respiratory function stable Cardiovascular status: blood pressure returned to baseline Postop Assessment: no apparent nausea or vomiting Anesthetic complications: no   No notable events documented.  Last Vitals:  Vitals:   10/13/22 0815 10/13/22 0830  BP: 131/79 136/84  Pulse: 73 66  Resp: 15 14  Temp:  36.4 C  SpO2: 100% 100%    Last Pain:  Vitals:   10/13/22 0856  TempSrc:   PainSc: [name]

## 2022-10-13 NOTE — Discharge Instructions (Addendum)
No acetaminophen/Tylenol until after 12:16pm today if needed for pain.    No ibuprofen, Advil, Aleve, Motrin, ketorolac, meloxicam, naproxen, or other NSAIDS until after 2:00pm today if needed for pain.     DISCHARGE INSTRUCTIONS: HYSTEROSCOPY  The following instructions have been prepared to help you care for yourself upon your return home.  Personal hygiene:  Use sanitary pads for vaginal drainage, not tampons.  Shower the day after your procedure.  NO tub baths, pools or Jacuzzis for 2-3 weeks.  Wipe front to back after using the bathroom.  Activity and limitations:  Do NOT drive or operate any equipment for 24 hours. The effects of anesthesia are still present and drowsiness may result.  Do NOT rest in bed all day.  Walking is encouraged.  Walk up and down stairs slowly.  You may resume your normal activity in one to two days or as indicated by your physician. Sexual activity: NO intercourse for at least 2 weeks after the procedure, or as indicated by your Doctor.  Diet: Eat a light meal as desired this evening. You may resume your usual diet tomorrow.  Return to Work: You may resume your work activities in one to two days or as indicated by Marine scientist.  What to expect after your surgery: Expect to have vaginal bleeding/discharge for 2-3 days and spotting for up to 10 days. It is not unusual to have soreness for up to 1-2 weeks. You may have a slight burning sensation when you urinate for the first day. Mild cramps may continue for a couple of days. You may have a regular period in 2-6 weeks.  Call your doctor for any of the following:  Excessive vaginal bleeding or clotting, saturating and changing one pad every hour.  Inability to urinate 6 hours after discharge from hospital.  Pain not relieved by pain medication.  Fever of 100.4 F or greater.  Unusual vaginal discharge or odor.     Post Anesthesia Home Care Instructions  Activity: Get plenty of rest for the  remainder of the day. A responsible individual must stay with you for 24 hours following the procedure.  For the next 24 hours, DO NOT: -Drive a car -Paediatric nurse -Drink alcoholic beverages -Take any medication unless instructed by your physician -Make any legal decisions or sign important papers.  Meals: Start with liquid foods such as gelatin or soup. Progress to regular foods as tolerated. Avoid greasy, spicy, heavy foods. If nausea and/or vomiting occur, drink only clear liquids until the nausea and/or vomiting subsides. Call your physician if vomiting continues.  Special Instructions/Symptoms: Your throat may feel dry or sore from the anesthesia or the breathing tube placed in your throat during surgery. If this causes discomfort, gargle with warm salt water. The discomfort should disappear within 24 hours.  If you had a scopolamine patch placed behind your ear for the management of post- operative nausea and/or vomiting:  1. The medication in the patch is effective for 72 hours, after which it should be removed.  Wrap patch in a tissue and discard in the trash. Wash hands thoroughly with soap and water. 2. You may remove the patch earlier than 72 hours if you experience unpleasant side effects which may include dry mouth, dizziness or visual disturbances. 3. Avoid touching the patch. Wash your hands with soap and water after contact with the patch.

## 2022-10-14 ENCOUNTER — Encounter (HOSPITAL_BASED_OUTPATIENT_CLINIC_OR_DEPARTMENT_OTHER): Payer: Self-pay | Admitting: Obstetrics and Gynecology

## 2022-10-15 LAB — SURGICAL PATHOLOGY

## 2022-10-27 ENCOUNTER — Other Ambulatory Visit (HOSPITAL_COMMUNITY): Payer: Self-pay

## 2022-10-27 MED ORDER — MISOPROSTOL 200 MCG PO TABS
800.0000 ug | ORAL_TABLET | ORAL | 0 refills | Status: DC
  Filled 2022-10-27: qty 4, 1d supply, fill #0

## 2022-11-06 ENCOUNTER — Encounter (HOSPITAL_BASED_OUTPATIENT_CLINIC_OR_DEPARTMENT_OTHER): Payer: Self-pay | Admitting: Obstetrics and Gynecology

## 2022-11-06 NOTE — Progress Notes (Signed)
Spoke w/ via phone for pre-op interview--- pt Lab needs dos----  no (per anes)/  pre-op orders pending             Lab results------ no COVID test -----patient states asymptomatic no test needed Arrive at ------- 1000 on 11-11-2022 NPO after MN NO Solid Food.  Clear liquids from MN until--- 0900 Med rec completed Medications to take morning of surgery ----- none Diabetic medication ----- n/a Patient instructed no nail polish to be worn day of surgery Patient instructed to bring photo id and insurance card day of surgery Patient aware to have Driver (ride ) / caregiver for 24 hours after surgery -- boyfriend, [place] Patient Special Instructions -----  n/a Pre-Op special Istructions ----- case just added on today, pre-op orders pending Patient verbalized understanding of instructions that were given at this phone interview. Patient denies shortness of breath, chest pain, fever, cough at this phone interview.

## 2022-11-10 ENCOUNTER — Other Ambulatory Visit: Payer: Self-pay | Admitting: Obstetrics and Gynecology

## 2022-11-10 DIAGNOSIS — Z01818 Encounter for other preprocedural examination: Secondary | ICD-10-CM

## 2022-11-10 NOTE — H&P (Signed)
See previous H&P below.    Postoperatively, pt continued to bleed despite uncomplicated D&E.  On 11-06-22, pt had [place] with suspicious for retained POC with blood flow.  Pt did cytotec x1 to see if would help expel products but pt has not seen any since.  For repeat D&E.  All risks benefits and alt d/w pt and she desires to proceed.   26 y.o. [id number] with 6-7 week miscarriage.       Past Medical History:  Diagnosis Date   GERD (gastroesophageal reflux disease)     History of gastric ulcer      hx  gastroparesis 2013,  per test 09/ 2017 resolved   History of Helicobacter pylori infection 2012    treated   Missed ab 10/2022   Wears glasses           Past Surgical History:  Procedure Laterality Date   CESAREAN SECTION N/A 03/07/2022    Procedure: CESAREAN SECTION;  Surgeon: Carrington Clamp, MD;  Location: [facility] LD ORS;  Service: Obstetrics;  Laterality: N/A;   ESOPHAGOGASTRODUODENOSCOPY   02/05/2011    dr Myrtie Neither   LAPAROSCOPIC CHOLECYSTECTOMY   05/13/2011    @[facility]  by dr   TONSILLECTOMY AND ADENOIDECTOMY   05/10/2001    @[facility]  by dr 07/10/2001    Social History         Socioeconomic History   Marital status: Single      Spouse name: Not on file   Number of children: 0   Years of education: Not on file   Highest education level: Not on file  Occupational History   Occupation: Student   Tobacco Use   Smoking status: Never   Smokeless tobacco: Never  Vaping Use   Vaping Use: Never used  Substance and Sexual Activity   Alcohol use: Not Currently      Comment: not while preg   Drug use: Not Currently      Types: Marijuana   Sexual activity: Yes      Birth control/protection: None  Other Topics Concern   Not on file  Social History Narrative    Daily caffeine          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          Current Outpatient Medications on File Prior to Encounter  Medication Sig Dispense Refill   acetaminophen (TYLENOL) 500 MG tablet Take 500 mg by mouth every 6 (six) hours as needed.       ibuprofen (ADVIL) 200 MG tablet Take 200 mg by mouth every 6 (six) hours as needed.       prenatal vitamin w/FE, FA (PRENATAL 1 + 1) 27-1 MG TABS tablet Take 1 tablet  by mouth daily at 12 noon. (Patient not taking: Reported on 10/09/2022)          No Known Allergies      Vitals:    10/09/22 1050  Weight: 122.5 kg  Height: 5\' 5"  (1.651 m)      Lungs: clear to ascultation Cor:  RRR Abdomen:  soft, nontender, nondistended. Ex:  no cords, erythema Pelvic:   NEFG, normal size uterus with closed cervix   A:  Miscarriage at about 6-7 weeks.  Confirmed no FHTs and no growth on .  Pt counseled and desires to proceed with suction D&E.     P: P: All risks, benefits and alternatives d/w patient and she desires to proceed.  Patient has undergone ERAS protocol and will receive preop antibiotics and SCDs during the operation.        [place]        Electronically signed by Loney Laurence, MD at 10/09/2022 11:09 AM

## 2022-11-11 ENCOUNTER — Encounter (HOSPITAL_BASED_OUTPATIENT_CLINIC_OR_DEPARTMENT_OTHER): Payer: Self-pay | Admitting: Obstetrics and Gynecology

## 2022-11-11 ENCOUNTER — Encounter (HOSPITAL_BASED_OUTPATIENT_CLINIC_OR_DEPARTMENT_OTHER)
Admission: RE | Disposition: A | Discharge status: Home / Self Care | Payer: Self-pay | Source: Home / Self Care | Attending: Obstetrics and Gynecology

## 2022-11-11 ENCOUNTER — Ambulatory Visit (HOSPITAL_BASED_OUTPATIENT_CLINIC_OR_DEPARTMENT_OTHER)
Admission: RE | Admit: 2022-11-11 | Discharge: 2022-11-11 | Disposition: A | Discharge status: Home / Self Care | Payer: Medicaid Other | Attending: Obstetrics and Gynecology | Admitting: Obstetrics and Gynecology

## 2022-11-11 ENCOUNTER — Ambulatory Visit (HOSPITAL_BASED_OUTPATIENT_CLINIC_OR_DEPARTMENT_OTHER): Payer: Medicaid Other | Admitting: Certified Registered Nurse Anesthetist

## 2022-11-11 ENCOUNTER — Other Ambulatory Visit: Payer: Self-pay

## 2022-11-11 DIAGNOSIS — Z3A01 Less than 8 weeks gestation of pregnancy: Secondary | ICD-10-CM

## 2022-11-11 DIAGNOSIS — O021 Missed abortion: Secondary | ICD-10-CM | POA: Diagnosis present

## 2022-11-11 DIAGNOSIS — O034 Incomplete spontaneous abortion without complication: Secondary | ICD-10-CM

## 2022-11-11 DIAGNOSIS — Z01818 Encounter for other preprocedural examination: Secondary | ICD-10-CM

## 2022-11-11 HISTORY — PX: DILATION AND CURETTAGE OF UTERUS: SHX78

## 2022-11-11 LAB — CBC
HCT: 41.8 % (ref 36.0–46.0)
Hemoglobin: 13 g/dL (ref 12.0–15.0)
MCH: 26.4 pg (ref 26.0–34.0)
MCHC: 31.1 g/dL (ref 30.0–36.0)
MCV: 85 fL (ref 80.0–100.0)
Platelets: 454 10*3/uL — ABNORMAL HIGH (ref 150–400)
RBC: 4.92 MIL/uL (ref 3.87–5.11)
RDW: 17.2 % — ABNORMAL HIGH (ref 11.5–15.5)
WBC: 10.4 10*3/uL (ref 4.0–10.5)
nRBC: 0 % (ref 0.0–0.2)

## 2022-11-11 LAB — TYPE AND SCREEN
ABO/RH(D): B POS
Antibody Screen: NEGATIVE

## 2022-11-11 SURGERY — DILATION AND CURETTAGE
Anesthesia: General | Site: Vagina

## 2022-11-11 MED ORDER — DEXAMETHASONE SODIUM PHOSPHATE 10 MG/ML IJ SOLN
INTRAMUSCULAR | Status: DC | PRN
  Administered 2022-11-11: 10 mg via INTRAVENOUS

## 2022-11-11 MED ORDER — ACETAMINOPHEN 10 MG/ML IV SOLN: 1000.0000 mg | Freq: Once | INTRAVENOUS | Status: DC | PRN

## 2022-11-11 MED ORDER — FENTANYL CITRATE (PF) 250 MCG/5ML IJ SOLN
INTRAMUSCULAR | Status: DC | PRN
  Administered 2022-11-11: 25 ug via INTRAVENOUS
  Administered 2022-11-11: 50 ug via INTRAVENOUS

## 2022-11-11 MED ORDER — AMISULPRIDE (ANTIEMETIC) 5 MG/2ML IV SOLN: 10.0000 mg | Freq: Once | INTRAVENOUS | Status: DC | PRN

## 2022-11-11 MED ORDER — MIDAZOLAM HCL 2 MG/2ML IJ SOLN
INTRAMUSCULAR | Status: DC | PRN
  Administered 2022-11-11: 2 mg via INTRAVENOUS

## 2022-11-11 MED ORDER — OXYCODONE HCL 5 MG PO TABS: 5.0000 mg | ORAL_TABLET | Freq: Once | ORAL | Status: DC | PRN

## 2022-11-11 MED ORDER — POVIDONE-IODINE 10 % EX SWAB: 2.0000 | Freq: Once | CUTANEOUS | Status: DC

## 2022-11-11 MED ORDER — ACETAMINOPHEN 325 MG PO TABS: 325.0000 mg | ORAL_TABLET | ORAL | Status: DC | PRN

## 2022-11-11 MED ORDER — FENTANYL CITRATE (PF) 100 MCG/2ML IJ SOLN: 25.0000 ug | INTRAMUSCULAR | Status: DC | PRN

## 2022-11-11 MED ORDER — ACETAMINOPHEN 500 MG PO TABS
ORAL_TABLET | ORAL | Status: AC
  Filled 2022-11-11: qty 2

## 2022-11-11 MED ORDER — ACETAMINOPHEN 160 MG/5ML PO SOLN: 325.0000 mg | ORAL | Status: DC | PRN

## 2022-11-11 MED ORDER — SCOPOLAMINE 1 MG/3DAYS TD PT72
1.0000 | MEDICATED_PATCH | TRANSDERMAL | Status: DC
  Administered 2022-11-11: 1.5 mg via TRANSDERMAL

## 2022-11-11 MED ORDER — PROMETHAZINE HCL 25 MG/ML IJ SOLN: 6.2500 mg | INTRAMUSCULAR | Status: DC | PRN

## 2022-11-11 MED ORDER — SODIUM CHLORIDE 0.9 % IV SOLN
100.0000 mg | INTRAVENOUS | Status: AC
  Administered 2022-11-11: 100 mg via INTRAVENOUS
  Filled 2022-11-11: qty 100

## 2022-11-11 MED ORDER — DEXMEDETOMIDINE HCL IN NACL 80 MCG/20ML IV SOLN
INTRAVENOUS | Status: DC | PRN
  Administered 2022-11-11: 8 ug via BUCCAL

## 2022-11-11 MED ORDER — FENTANYL CITRATE (PF) 100 MCG/2ML IJ SOLN
INTRAMUSCULAR | Status: AC
  Filled 2022-11-11: qty 2

## 2022-11-11 MED ORDER — ONDANSETRON HCL 4 MG/2ML IJ SOLN
INTRAMUSCULAR | Status: DC | PRN
  Administered 2022-11-11: 4 mg via INTRAVENOUS

## 2022-11-11 MED ORDER — ACETAMINOPHEN 500 MG PO TABS
1000.0000 mg | ORAL_TABLET | Freq: Once | ORAL | Status: AC
  Administered 2022-11-11: 1000 mg via ORAL

## 2022-11-11 MED ORDER — LIDOCAINE 2% (20 MG/ML) 5 ML SYRINGE
INTRAMUSCULAR | Status: DC | PRN
  Administered 2022-11-11: 50 mg via INTRAVENOUS

## 2022-11-11 MED ORDER — PROPOFOL 10 MG/ML IV BOLUS
INTRAVENOUS | Status: AC
  Filled 2022-11-11: qty 20

## 2022-11-11 MED ORDER — SOD CITRATE-CITRIC ACID 500-334 MG/5ML PO SOLN: 30.0000 mL | ORAL | Status: DC

## 2022-11-11 MED ORDER — 0.9 % SODIUM CHLORIDE (POUR BTL) OPTIME
TOPICAL | Status: DC | PRN
  Administered 2022-11-11: 500 mL

## 2022-11-11 MED ORDER — LACTATED RINGERS IV SOLN: INTRAVENOUS | Status: DC

## 2022-11-11 MED ORDER — METHYLERGONOVINE MALEATE 0.2 MG/ML IJ SOLN
INTRAMUSCULAR | Status: DC | PRN
  Administered 2022-11-11: .2 mg via INTRAMUSCULAR

## 2022-11-11 MED ORDER — PROPOFOL 10 MG/ML IV BOLUS
INTRAVENOUS | Status: DC | PRN
  Administered 2022-11-11: 200 mg via INTRAVENOUS

## 2022-11-11 MED ORDER — KETOROLAC TROMETHAMINE 30 MG/ML IJ SOLN
INTRAMUSCULAR | Status: DC | PRN
  Administered 2022-11-11: 30 mg via INTRAVENOUS

## 2022-11-11 MED ORDER — SCOPOLAMINE 1 MG/3DAYS TD PT72
MEDICATED_PATCH | TRANSDERMAL | Status: AC
  Filled 2022-11-11: qty 1

## 2022-11-11 MED ORDER — OXYCODONE HCL 5 MG/5ML PO SOLN: 5.0000 mg | Freq: Once | ORAL | Status: DC | PRN

## 2022-11-11 MED ORDER — MIDAZOLAM HCL 2 MG/2ML IJ SOLN
INTRAMUSCULAR | Status: AC
  Filled 2022-11-11: qty 2

## 2022-11-11 MED ORDER — TRANEXAMIC ACID-NACL 1000-0.7 MG/100ML-% IV SOLN
INTRAVENOUS | Status: AC
  Filled 2022-11-11: qty 100

## 2022-11-11 SURGICAL SUPPLY — 18 items
CATH ROBINSON RED A/P 16FR (CATHETERS) ×2 IMPLANT
CNTNR URN SCR LID CUP LEK RST (MISCELLANEOUS) ×2 IMPLANT
CONT SPEC 4OZ STRL OR WHT (MISCELLANEOUS)
DRSG TELFA 3X8 NADH STRL (GAUZE/BANDAGES/DRESSINGS) ×2 IMPLANT
GAUZE 4X4 16PLY ~~LOC~~+RFID DBL (SPONGE) ×2 IMPLANT
GLOVE BIO SURGEON STRL SZ7 (GLOVE) ×2 IMPLANT
GLOVE BIOGEL PI IND STRL 7.0 (GLOVE) ×2 IMPLANT
GOWN STRL REUS W/TWL LRG LVL3 (GOWN DISPOSABLE) ×4 IMPLANT
HIBICLENS CHG 4% 4OZ BTL (MISCELLANEOUS) ×2 IMPLANT
HOSE CONNECTING 18IN BERKELEY (TUBING) IMPLANT
KIT TURNOVER CYSTO (KITS) ×2 IMPLANT
PACK VAGINAL MINOR WOMEN LF (CUSTOM PROCEDURE TRAY) ×2 IMPLANT
PAD OB MATERNITY 4.3X12.25 (PERSONAL CARE ITEMS) ×2 IMPLANT
PAD PREP 24X48 CUFFED NSTRL (MISCELLANEOUS) ×2 IMPLANT
SOL PREP POV-IOD 4OZ 10% (MISCELLANEOUS) IMPLANT
TOWEL OR 17X24 6PK STRL BLUE (TOWEL DISPOSABLE) IMPLANT
TOWEL OR 17X26 10 PK STRL BLUE (TOWEL DISPOSABLE) ×4 IMPLANT
VACURETTE 9 RIGID CVD (CANNULA) IMPLANT

## 2022-11-11 NOTE — Op Note (Signed)
  11/11/2022  12:10 PM  PATIENT:  Miranda Hawkins  26 y.o. female  PRE-OPERATIVE DIAGNOSIS:  RETAINED PRODUCTS OF CONCEPTION  POST-OPERATIVE DIAGNOSIS:  RETAINED PRODUCTS OF CONCEPTION  PROCEDURE:  Procedure(s): DILATATION AND CURETTAGE (N/A)  SURGEON:  Surgeon(s) and Role:    * Carrington Clamp, MD - Primary  ANESTHESIA:   general  EBL:  5 mL   LOCAL MEDICATIONS USED:  NONE  SPECIMEN:  Source of Specimen:  uterine contents  DISPOSITION OF SPECIMEN:  PATHOLOGY  COUNTS:  YES  TOURNIQUET:  * No tourniquets in log *  DICTATION: .Note written in EPIC  PLAN OF CARE: Discharge to home after PACU  PATIENT DISPOSITION:  PACU - hemodynamically stable.   Delay start of Pharmacological VTE agent (>24hrs) due to surgical blood loss or risk of bleeding: not applicable    Medications: Methergine  Complications: None  Findings:  7 week size uterus to 6 size post procedure.  Good crie was achieved.  Possible perforation of fundus but no bleeding except for normal.   After adequate anesthesia was achieved, the patient was prepped and draped in the usual sterile fashion.  The speculum was placed in the vagina and the cervix stabilized with a single-tooth tenaculum.  The cervix was dilated with Shawnie Pons dilators and the 9 mm curette was used to remove contents of the uterus.  Alternating sharp curettage with a curette and suction curettage was performed until all contents were removed and good crie was achieved.  All instruments were removed from the vagina.  The patient tolerated the procedure well.    Omya Winfield A

## 2022-11-11 NOTE — Brief Op Note (Signed)
11/11/2022  12:10 PM  PATIENT:  Miranda Hawkins  26 y.o. female  PRE-OPERATIVE DIAGNOSIS:  RETAINED PRODUCTS OF CONCEPTION  POST-OPERATIVE DIAGNOSIS:  RETAINED PRODUCTS OF CONCEPTION  PROCEDURE:  Procedure(s): DILATATION AND CURETTAGE (N/A)  SURGEON:  Surgeon(s) and Role:    * Carrington Clamp, MD - Primary  ANESTHESIA:   general  EBL:  5 mL   LOCAL MEDICATIONS USED:  NONE  SPECIMEN:  Source of Specimen:  uterine contents  DISPOSITION OF SPECIMEN:  PATHOLOGY  COUNTS:  YES  TOURNIQUET:  * No tourniquets in log *  DICTATION: .Note written in EPIC  PLAN OF CARE: Discharge to home after PACU  PATIENT DISPOSITION:  PACU - hemodynamically stable.   Delay start of Pharmacological VTE agent (>24hrs) due to surgical blood loss or risk of bleeding: not applicable

## 2022-11-11 NOTE — Anesthesia Procedure Notes (Signed)
Procedure Name: LMA Insertion Date/Time: 11/11/2022 11:51 AM  Performed by: Dairl Ponder, CRNAPre-anesthesia Checklist: Patient identified, Emergency Drugs available, Suction available and Patient being monitored Patient Re-evaluated:Patient Re-evaluated prior to induction Oxygen Delivery Method: Circle System Utilized Preoxygenation: Pre-oxygenation with 100% oxygen Induction Type: IV induction Ventilation: Mask ventilation without difficulty LMA: LMA inserted LMA Size: 4.0 Number of attempts: 1 Airway Equipment and Method: Bite block Placement Confirmation: positive ETCO2 Tube secured with: Tape Dental Injury: Teeth and Oropharynx as per pre-operative assessment

## 2022-11-11 NOTE — Anesthesia Postprocedure Evaluation (Signed)
Anesthesia Post Note  Patient: NORVELL URESTE  Procedure(s) Performed: DILATATION AND CURETTAGE (Vagina )     Patient location during evaluation: PACU Anesthesia Type: General Level of consciousness: awake and alert Pain management: pain level controlled Vital Signs Assessment: post-procedure vital signs reviewed and stable Respiratory status: spontaneous breathing, nonlabored ventilation, respiratory function stable and patient connected to nasal cannula oxygen Cardiovascular status: blood pressure returned to baseline and stable Postop Assessment: no apparent nausea or vomiting Anesthetic complications: no   No notable events documented.  Last Vitals:  Vitals:   11/11/22 1245 11/11/22 1305  BP: (!) 151/93 (!) 139/99  Pulse: 65 (!) 54  Resp: 16 16  Temp:  36.6 C  SpO2: 99% 100%    Last Pain:  Vitals:   11/11/22 1245  TempSrc:   PainSc: 0-No pain                 Shelton Silvas

## 2022-11-11 NOTE — Progress Notes (Signed)
There has been no change in the patients history, status or exam since the history and physical.  Vitals:   11/06/22 1700 11/11/22 1032  BP:  (!) 144/94  Pulse:  63  Resp:  18  Temp:  97.7 F (36.5 C)  TempSrc:  Oral  SpO2:  99%  Weight: 125.5 kg 127.4 kg  Height: 5\' 6"  (1.676 m) 5\' 6"  (1.676 m)    No results found for this or any previous visit (from the past 72 hour(s)).  

## 2022-11-11 NOTE — Discharge Instructions (Addendum)
   D & C Home care Instructions:   Personal hygiene:  Used sanitary napkins for vaginal drainage not tampons. Shower or tub bathe the day after your procedure. No douching until bleeding stops. Always wipe from front to back after  Elimination.  Activity: Do not drive or operate any equipment today. The effects of the anesthesia are still present and drowsiness may result. Rest today, not necessarily flat bed rest, just take it easy. You may resume your normal activity in one to 2 days.  Sexual activity: No intercourse for one week or as indicated by your physician  Diet: Eat a light diet as desired this evening. You may resume a regular diet tomorrow.  Return to work: One to 2 days.  General Expectations of your surgery: Vaginal bleeding should be no heavier than a normal period. Spotting may continue up to 10 days. Mild cramps may continue for a couple of days. You may have a regular period in 2-6 weeks.  Unexpected observations call your doctor if these occur: persistent or heavy bleeding. Severe abdominal cramping or pain. Elevation of temperature greater than 100F.  Call for an appointment in one week.    Post Anesthesia Home Care Instructions  Activity: Get plenty of rest for the remainder of the day. A responsible individual must stay with you for 24 hours following the procedure.  For the next 24 hours, DO NOT: -Drive a car -Advertising copywriter -Drink alcoholic beverages -Take any medication unless instructed by your physician -Make any legal decisions or sign important papers.  Meals: Start with liquid foods such as gelatin or soup. Progress to regular foods as tolerated. Avoid greasy, spicy, heavy foods. If nausea and/or vomiting occur, drink only clear liquids until the nausea and/or vomiting subsides. Call your physician if vomiting continues.  Special Instructions/Symptoms: Your throat may feel dry or sore from the anesthesia or the breathing tube placed in your  throat during surgery. If this causes discomfort, gargle with warm salt water. The discomfort should disappear within 24 hours.  If you had a scopolamine patch placed behind your ear for the management of post- operative nausea and/or vomiting:  1. The medication in the patch is effective for 72 hours, after which it should be removed.  Wrap patch in a tissue and discard in the trash. Wash hands thoroughly with soap and water. 2. You may remove the patch earlier than 72 hours if you experience unpleasant side effects which may include dry mouth, dizziness or visual disturbances. 3. Avoid touching the patch. Wash your hands with soap and water after contact with the patch.    No ibuprofen, Advil, Aleve, Motrin, ketorolac, meloxicam, naproxen, or other NSAIDS until after 6 pm today if needed. No acetaminophen/Tylenol until after 4:45 pm today if needed.

## 2022-11-11 NOTE — Transfer of Care (Signed)
Immediate Anesthesia Transfer of Care Note  Patient: Miranda Hawkins  Procedure(s) Performed: DILATATION AND CURETTAGE (Vagina )  Patient Location: PACU  Anesthesia Type:General  Level of Consciousness: awake, alert , and oriented  Airway & Oxygen Therapy: Patient Spontanous Breathing  Post-op Assessment: Report given to RN and Post -op Vital signs reviewed and stable  Post vital signs: Reviewed and stable  Last Vitals:  Vitals Value Taken Time  BP 130/99 11/11/22 1220  Temp 36.4 C 11/11/22 1219  Pulse 73 11/11/22 1223  Resp 9 11/11/22 1223  SpO2 98 % 11/11/22 1223  Vitals shown include unvalidated device data.  Last Pain:  Vitals:   11/11/22 1219  TempSrc:   PainSc: 0-No pain      Patients Stated Pain Goal: 3 (11/11/22 1032)  Complications: No notable events documented.

## 2022-11-11 NOTE — Anesthesia Preprocedure Evaluation (Addendum)
Anesthesia Evaluation  Patient identified by MRN, date of birth, ID band Patient awake    Reviewed: Allergy & Precautions, NPO status , Patient's Chart, lab work & pertinent test results  Airway Mallampati: I  TM Distance: >3 FB Neck ROM: Full    Dental  (+) Teeth Intact, Dental Advisory Given   Pulmonary neg pulmonary ROS   breath sounds clear to auscultation       Cardiovascular negative cardio ROS  Rhythm:Regular Rate:Normal     Neuro/Psych negative neurological ROS  negative psych ROS   GI/Hepatic Neg liver ROS,GERD  ,,  Endo/Other  negative endocrine ROS    Renal/GU negative Renal ROS     Musculoskeletal negative musculoskeletal ROS (+)    Abdominal   Peds  Hematology negative hematology ROS (+)   Anesthesia Other Findings   Reproductive/Obstetrics                             Anesthesia Physical Anesthesia Plan  ASA: 3  Anesthesia Plan: General   Post-op Pain Management:    Induction: Intravenous  PONV Risk Score and Plan: 4 or greater and Ondansetron, Dexamethasone, Midazolam and Scopolamine patch - Pre-op  Airway Management Planned: LMA  Additional Equipment: None  Intra-op Plan:   Post-operative Plan: Extubation in OR  Informed Consent: I have reviewed the patients History and Physical, chart, labs and discussed the procedure including the risks, benefits and alternatives for the proposed anesthesia with the patient or authorized representative who has indicated his/her understanding and acceptance.     Dental advisory given  Plan Discussed with: CRNA  Anesthesia Plan Comments:        Anesthesia Quick Evaluation

## 2022-11-12 ENCOUNTER — Encounter (HOSPITAL_BASED_OUTPATIENT_CLINIC_OR_DEPARTMENT_OTHER): Payer: Self-pay | Admitting: Obstetrics and Gynecology

## 2022-11-12 LAB — SURGICAL PATHOLOGY

## 2022-11-27 ENCOUNTER — Other Ambulatory Visit (HOSPITAL_COMMUNITY): Payer: Self-pay

## 2022-11-27 MED ORDER — JUNEL FE 24 1-20 MG-MCG(24) PO TABS
1.0000 | ORAL_TABLET | Freq: Every day | ORAL | 4 refills | Status: DC
  Filled 2022-11-27: qty 84, 84d supply, fill #0

## 2022-12-11 ENCOUNTER — Other Ambulatory Visit (HOSPITAL_COMMUNITY): Payer: Self-pay

## 2022-12-14 ENCOUNTER — Other Ambulatory Visit (HOSPITAL_COMMUNITY): Payer: Self-pay

## 2022-12-14 MED ORDER — TRAMADOL HCL 50 MG PO TABS
50.0000 mg | ORAL_TABLET | Freq: Four times a day (QID) | ORAL | 0 refills | Status: DC | PRN
  Filled 2022-12-14: qty 14, 4d supply, fill #0

## 2022-12-14 MED ORDER — METHOCARBAMOL 500 MG PO TABS
500.0000 mg | ORAL_TABLET | Freq: Four times a day (QID) | ORAL | 0 refills | Status: DC
  Filled 2022-12-14: qty 20, 5d supply, fill #0

## 2022-12-28 ENCOUNTER — Other Ambulatory Visit (HOSPITAL_COMMUNITY): Payer: Self-pay

## 2022-12-28 MED ORDER — DRYSOL 20 % EX SOLN
CUTANEOUS | 0 refills | Status: AC
  Filled 2022-12-28: qty 35, 30d supply, fill #0

## 2023-01-06 ENCOUNTER — Other Ambulatory Visit (HOSPITAL_COMMUNITY): Payer: Self-pay

## 2023-01-07 ENCOUNTER — Other Ambulatory Visit (HOSPITAL_COMMUNITY): Payer: Self-pay

## 2023-01-07 MED ORDER — VITAMIN D (ERGOCALCIFEROL) 1.25 MG (50000 UNIT) PO CAPS
50000.0000 [IU] | ORAL_CAPSULE | ORAL | 0 refills | Status: AC
  Filled 2023-01-07: qty 9, 63d supply, fill #0

## 2023-01-19 ENCOUNTER — Other Ambulatory Visit (HOSPITAL_COMMUNITY): Payer: Self-pay

## 2023-02-02 ENCOUNTER — Other Ambulatory Visit (HOSPITAL_COMMUNITY): Payer: Self-pay

## 2023-02-02 MED ORDER — JUNEL FE 24 1-20 MG-MCG(24) PO TABS
1.0000 | ORAL_TABLET | Freq: Every day | ORAL | 4 refills | Status: DC
  Filled 2023-02-02: qty 84, 84d supply, fill #0

## 2023-02-02 MED ORDER — VALACYCLOVIR HCL 500 MG PO TABS
500.0000 mg | ORAL_TABLET | Freq: Two times a day (BID) | ORAL | 4 refills | Status: AC
  Filled 2023-02-02: qty 90, 45d supply, fill #0

## 2023-02-04 ENCOUNTER — Other Ambulatory Visit (HOSPITAL_COMMUNITY): Payer: Self-pay

## 2023-02-04 MED ORDER — METRONIDAZOLE 500 MG PO TABS
500.0000 mg | ORAL_TABLET | Freq: Two times a day (BID) | ORAL | 0 refills | Status: DC
  Filled 2023-02-04: qty 14, 7d supply, fill #0

## 2023-12-24 ENCOUNTER — Other Ambulatory Visit (HOSPITAL_COMMUNITY): Payer: Self-pay

## 2023-12-24 MED ORDER — VITAMIN D (ERGOCALCIFEROL) 1.25 MG (50000 UNIT) PO CAPS
50000.0000 [IU] | ORAL_CAPSULE | ORAL | 0 refills | Status: AC
  Filled 2023-12-24: qty 8, 56d supply, fill #0

## 2023-12-31 ENCOUNTER — Other Ambulatory Visit (HOSPITAL_COMMUNITY): Payer: Self-pay

## 2023-12-31 MED ORDER — ONDANSETRON HCL 8 MG PO TABS
8.0000 mg | ORAL_TABLET | ORAL | 1 refills | Status: AC | PRN
  Filled 2023-12-31: qty 4, 1d supply, fill #0

## 2024-03-29 ENCOUNTER — Encounter (HOSPITAL_COMMUNITY): Payer: Self-pay

## 2024-03-29 ENCOUNTER — Inpatient Hospital Stay (HOSPITAL_COMMUNITY)
Admission: AD | Admit: 2024-03-29 | Discharge: 2024-03-29 | Disposition: A | Discharge status: Home / Self Care | Payer: Self-pay | Attending: Obstetrics and Gynecology | Admitting: Obstetrics and Gynecology

## 2024-03-29 ENCOUNTER — Other Ambulatory Visit (HOSPITAL_COMMUNITY): Payer: Self-pay

## 2024-03-29 ENCOUNTER — Inpatient Hospital Stay (HOSPITAL_COMMUNITY): Payer: Self-pay

## 2024-03-29 DIAGNOSIS — O209 Hemorrhage in early pregnancy, unspecified: Secondary | ICD-10-CM | POA: Insufficient documentation

## 2024-03-29 DIAGNOSIS — Z3201 Encounter for pregnancy test, result positive: Secondary | ICD-10-CM | POA: Diagnosis not present

## 2024-03-29 DIAGNOSIS — O3680X0 Pregnancy with inconclusive fetal viability, not applicable or unspecified: Secondary | ICD-10-CM | POA: Insufficient documentation

## 2024-03-29 DIAGNOSIS — Z3A00 Weeks of gestation of pregnancy not specified: Secondary | ICD-10-CM | POA: Diagnosis not present

## 2024-03-29 DIAGNOSIS — R1030 Lower abdominal pain, unspecified: Secondary | ICD-10-CM | POA: Diagnosis not present

## 2024-03-29 DIAGNOSIS — O26899 Other specified pregnancy related conditions, unspecified trimester: Secondary | ICD-10-CM | POA: Insufficient documentation

## 2024-03-29 LAB — POCT PREGNANCY, URINE: Preg Test, Ur: POSITIVE — AB

## 2024-03-29 LAB — CBC
HCT: 38.2 % (ref 36.0–46.0)
Hemoglobin: 12.2 g/dL (ref 12.0–15.0)
MCH: 27.9 pg (ref 26.0–34.0)
MCHC: 31.9 g/dL (ref 30.0–36.0)
MCV: 87.2 fL (ref 80.0–100.0)
Platelets: 334 10*3/uL (ref 150–400)
RBC: 4.38 MIL/uL (ref 3.87–5.11)
RDW: 15.9 % — ABNORMAL HIGH (ref 11.5–15.5)
WBC: 13.1 10*3/uL — ABNORMAL HIGH (ref 4.0–10.5)
nRBC: 0 % (ref 0.0–0.2)

## 2024-03-29 LAB — HCG, QUANTITATIVE, PREGNANCY: hCG, Beta Chain, Quant, S: 36 m[IU]/mL — ABNORMAL HIGH (ref <5)

## 2024-03-29 MED ORDER — ACETAMINOPHEN-CODEINE 300-30 MG PO TABS
1.0000 | ORAL_TABLET | Freq: Four times a day (QID) | ORAL | 0 refills | Status: AC | PRN
Start: 2024-03-29 — End: ?
  Filled 2024-03-29: qty 15, 2d supply, fill #0

## 2024-03-29 MED ORDER — KETOROLAC TROMETHAMINE 60 MG/2ML IM SOLN
60.0000 mg | Freq: Once | INTRAMUSCULAR | Status: AC
  Administered 2024-03-29: 60 mg via INTRAMUSCULAR
  Filled 2024-03-29: qty 2

## 2024-03-29 MED ORDER — IBUPROFEN 600 MG PO TABS
600.0000 mg | ORAL_TABLET | Freq: Four times a day (QID) | ORAL | 0 refills | Status: AC | PRN
  Filled 2024-03-29: qty 60, 15d supply, fill #0

## 2024-03-29 MED ORDER — METOCLOPRAMIDE HCL 10 MG PO TABS
10.0000 mg | ORAL_TABLET | Freq: Four times a day (QID) | ORAL | 0 refills | Status: AC
  Filled 2024-03-29: qty 30, 8d supply, fill #0

## 2024-03-29 NOTE — MAU Provider Note (Signed)
 History     CSN: [id number]  Arrival date and time: 03/29/24 0751  **Report received from Franchot Ion, MD @ [phone]. Patient not seen by Dr. Cresenzo, only orders placed by him.**   Event Date/Time   First Provider Initiated Contact with Patient 03/29/24 [phone]      Chief Complaint  Patient presents with   Abdominal Pain   Vaginal Bleeding   HPI Miranda Hawkins is a 28 y.o. year old [id number] female at Unknown weeks gestation who presents to MAU via EMS reporting heavy vaginal bleeding. She reports LMP was early December 2024. She had an EAB in January @ [facility]. She reports vaginal spotting at the end of January, but "no period." She reports "severe" lower abdominal pain @ 0400 this morning. She reports light VB yesterday afternoon. She states she started passing "large" blood clots at ~0530 this morning. She has not taken a HPT. She has had no f/u since January at [facility]. Her sister is present and contributing to the history taking.   OB History     Gravida  4   Para  1   Term  1   Preterm      AB  2   Living  1      SAB  1   IAB  1   Ectopic      Multiple  0   Live Births  1           Past Medical History:  Diagnosis Date   GERD (gastroesophageal reflux disease)    History of gastric ulcer    hx  gastroparesis 2013,  per test 09/ 2017 resolved   History of Helicobacter pylori infection 2012   treated   Missed ab 10/2022   s/p  D&E 10-13-2022 with post retained products of conception 11-06-2022   Wears glasses     Past Surgical History:  Procedure Laterality Date   CESAREAN SECTION N/A 03/07/2022   Procedure: CESAREAN SECTION;  Surgeon: Matt Song, MD;  Location: [facility];  Service: Obstetrics;  Laterality: N/A;   DILATION AND CURETTAGE OF UTERUS N/A 11/11/2022   Procedure: DILATATION AND EVACUATION;  Surgeon: Matt Song, MD;  Location: [facility];  Service: Gynecology;  Laterality: N/A;   DILATION  AND EVACUATION N/A 10/13/2022   Procedure: DILATATION AND EVACUATION;  Surgeon: Matt Song, MD;  Location: [facility];  Service: Gynecology;  Laterality: N/A;   ESOPHAGOGASTRODUODENOSCOPY  02/05/2011   dr Dominic Friendly   LAPAROSCOPIC CHOLECYSTECTOMY  05/13/2011   @[facility]  by dr Alray Askew   TONSILLECTOMY AND ADENOIDECTOMY  05/10/2001   @[facility]  by dr Odean Bend    Family History  Problem Relation Age of Onset   Hypertension Mother    Hyperlipidemia Mother    Alcoholism Father    Arthritis Father        paternal grandparents   Diabetes Maternal Grandmother    Arthritis Maternal Grandmother    Heart disease Maternal Grandmother    Diabetes Maternal Aunt    Alcoholism Unknown        maternal grandparents   Hyperlipidemia Unknown        all four grandparents   Stroke Unknown    Mental illness Unknown        father's side   Heart disease Paternal Grandfather    Colon cancer Neg Hx     Social History   Tobacco Use   Smoking status: Never   Smokeless tobacco: Never  Vaping Use   Vaping status: Never Used  Substance Use Topics   Alcohol use: Not Currently    Comment: not while preg   Drug use: Not Currently    Types: Marijuana    Allergies: No Known Allergies  No medications prior to admission.    Review of Systems  Constitutional: Negative.   HENT: Negative.    Eyes: Negative.   Respiratory: Negative.    Cardiovascular: Negative.   Gastrointestinal: Negative.   Endocrine: Negative.   Genitourinary:  Positive for pelvic pain ("contraction-like pain") and vaginal bleeding (heavy with clots).       "Cramping is better since I passed a large blood clot in the toilet right after I got here."  Musculoskeletal: Negative.   Skin: Negative.   Allergic/Immunologic: Negative.   Neurological: Negative.   Hematological: Negative.   Psychiatric/Behavioral: Negative.     Physical Exam   Blood pressure 110/60, pulse 74, temperature 97.7 F (36.5 C), temperature source  Oral, resp. rate 18, height 5\' 6"  (1.676 m), weight 127 kg, SpO2 100%.  Physical Exam Vitals and nursing note reviewed. Exam conducted with a chaperone present.  Constitutional:      Appearance: Normal appearance. She is obese.  Cardiovascular:     Rate and Rhythm: Normal rate.  Pulmonary:     Effort: Pulmonary effort is normal.  Abdominal:     Palpations: Abdomen is soft.  Genitourinary:    General: Normal vulva.     Comments: Pelvic exam: External genitalia normal, SE: vaginal walls pink and well rugated, cervix is smooth, pink, no lesions, moderate amt of BRB in vaginal vault -- cervix visually closed.   Musculoskeletal:        General: Normal range of motion.  Skin:    General: Skin is warm and dry.  Neurological:     Mental Status: She is alert and oriented to person, place, and time.  Psychiatric:        Mood and Affect: Mood normal.        Behavior: Behavior normal.        Thought Content: Thought content normal.        Judgment: Judgment normal.    MAU Course  Procedures  MDM CCUA UPT CBC HCG Wet Prep GC/CT -- Results pending  RPR -- Results pending  OB U/S < 14 wks TVUS   *Consult with Dr. Alto Atta @ 1040 - notified of patient's complaints, assessments, lab & U/S results, tx plan repeat HCG in 48 hours since unsure if this is a new pregnancy - ok to d/c home, agrees with plan   Results for orders placed or performed during the hospital encounter of 03/29/24 (from the past 24 hours)  Pregnancy, urine POC     Status: Abnormal   Collection Time: 03/29/24  8:01 AM  Result Value Ref Range   Preg Test, Ur POSITIVE (A) NEGATIVE  CBC     Status: Abnormal   Collection Time: 03/29/24  8:34 AM  Result Value Ref Range   WBC 13.1 (H) 4.0 - 10.5 K/uL   RBC 4.38 3.87 - 5.11 MIL/uL   Hemoglobin 12.2 12.0 - 15.0 g/dL   HCT 16.1 09.6 - 04.5 %   MCV 87.2 80.0 - 100.0 fL   MCH 27.9 26.0 - 34.0 pg   MCHC 31.9 30.0 - 36.0 g/dL   RDW 40.9 (H) 81.1 - 91.4 %   Platelets 334  150 - 400 K/uL   nRBC 0.0 0.0 - 0.2 %  hCG, quantitative, pregnancy     Status: Abnormal   Collection Time: 03/29/24  8:34 AM  Result Value Ref Range   hCG, Beta Chain, Quant, S 36 (H) <5 mIU/mL    US  OB LESS THAN 14 WEEKS WITH OB TRANSVAGINAL Result Date: 03/29/2024 CLINICAL DATA:  [id number] Vaginal bleeding affecting early pregnancy [id number] EXAM: OBSTETRIC <14 WK US  AND TRANSVAGINAL OB US  TECHNIQUE: Both transabdominal and transvaginal ultrasound examinations were performed for complete evaluation of the gestation as well as the maternal uterus, adnexal regions, and pelvic cul-de-sac. Transvaginal technique was performed to assess early pregnancy. COMPARISON:  September 13, 2022 FINDINGS: Intrauterine gestational sac: None Yolk sac:  Not Visualized. Embryo:  Not Visualized. Cardiac Activity: Not Visualized. Subchorionic hemorrhage:  None visualized. Maternal uterus/adnexae: The uterus is not enlarged. The endometrium measures 9 mm, which is within normal limits for patient's age. Hypoechoic material within the endocervical canal, likely reflecting blood products. Nonenlarged right ovary is within normal limits for patient's age. The left ovary was not visualized due to overlying bowel gas. IMPRESSION: 1. No intrauterine pregnancy. In the setting of a positive beta HCG, differential considerations include an early pregnancy, complete miscarriage, or an unseen ectopic pregnancy. Obstetric consultation with serial beta hCG and sonographic follow-up should be obtained, as clinically warranted. 2. Hypoechoic material within the endocervical canal, possibly representing blood products. Electronically Signed   By: Rance Burrows M.D.   On: 03/29/2024 09:30    Assessment and Plan  1. Pregnancy of unknown anatomic location (Primary) - Unsure if new pregnancy or retained POC from EAB  2. Vaginal bleeding affecting early pregnancy - Return to MAU: If you have heavier bleeding that soaks through more that 2 pads  per hour for an hour or more If you bleed so much that you feel like you might pass out or you do pass out If you have significant abdominal pain that is not improved with Tylenol  1000 mg every 8 hours as needed for pain If you develop a fever > 100.5    - Discharge home - F/U at [facility] on Friday 03/31/2024 for Stat HCG. Message to Clinical Pool and Dr. Racheal Buddle (second attending for that day). - Patient verbalized an understanding of the plan of care and agrees.   Almond Army, CNM 03/29/2024, 8:53 AM

## 2024-03-29 NOTE — Discharge Instructions (Signed)
 Return to MAU: If you have heavier bleeding that soaks through more that 2 pads per hour for an hour or more If you bleed so much that you feel like you might pass out or you do pass out If you have significant abdominal pain that is not improved with Tylenol 1000 mg every 8 hours as needed for pain If you develop a fever > 100.5

## 2024-03-29 NOTE — MAU Note (Signed)
 MAU Triage Note:  .Miranda Hawkins is a 28 y.o. at Unknown here in MAU reporting: reports last period in early December and had a termination of pregnancy in January ([facility]). Had spotting around the end of January, but did not have a period. Started having severe, lower abdominal pain around 0400 this morning. She also started having light VB Tuesday afternoon. Started passing large clots around 0530 this morning. Has not taken a pregnancy test and has not been seen anywhere since being seen at [facility] in January.   RN notes moderate amount of bleeding on patient's pad - reports she changed prior to leaving with EMS. Blood also noted in toilet.   EMS placed IV 20g RAC.   UPT positive in MAU.   Pain Score: 9  Pain Location: Abdomen     Onset of complaint: today around 0400 LMP: No LMP recorded. Last known period early December. Had spotting near end of January.   Vitals:   03/29/24 0800  BP: 131/77  Pulse: 66  Resp: (!) 22  Temp: 97.7 F (36.5 C)  SpO2: 100%     Lab orders placed from triage: Orders in prior to triage

## 2024-03-31 ENCOUNTER — Other Ambulatory Visit: Payer: Self-pay

## 2024-03-31 DIAGNOSIS — O3680X0 Pregnancy with inconclusive fetal viability, not applicable or unspecified: Secondary | ICD-10-CM

## 2024-03-31 LAB — BETA HCG QUANT (REF LAB): hCG Quant: 4 m[IU]/mL
